# Patient Record
Sex: Female | Born: 1975 | Race: Black or African American | Hispanic: No | Marital: Married | Smoking: Never smoker
Health system: Southern US, Community
[De-identification: ages and names within clinical notes are randomized; demographics above are authoritative.]

## PROBLEM LIST (undated history)

## (undated) DIAGNOSIS — N926 Irregular menstruation, unspecified: Secondary | ICD-10-CM

## (undated) DIAGNOSIS — E282 Polycystic ovarian syndrome: Secondary | ICD-10-CM

## (undated) DIAGNOSIS — M2550 Pain in unspecified joint: Secondary | ICD-10-CM

## (undated) DIAGNOSIS — J309 Allergic rhinitis, unspecified: Secondary | ICD-10-CM

## (undated) DIAGNOSIS — N809 Endometriosis, unspecified: Secondary | ICD-10-CM

## (undated) DIAGNOSIS — R5383 Other fatigue: Secondary | ICD-10-CM

## (undated) DIAGNOSIS — B379 Candidiasis, unspecified: Secondary | ICD-10-CM

## (undated) DIAGNOSIS — F988 Other specified behavioral and emotional disorders with onset usually occurring in childhood and adolescence: Secondary | ICD-10-CM

## (undated) DIAGNOSIS — E739 Lactose intolerance, unspecified: Secondary | ICD-10-CM

## (undated) DIAGNOSIS — J45909 Unspecified asthma, uncomplicated: Secondary | ICD-10-CM

## (undated) DIAGNOSIS — K5900 Constipation, unspecified: Secondary | ICD-10-CM

## (undated) DIAGNOSIS — S83419A Sprain of medial collateral ligament of unspecified knee, initial encounter: Secondary | ICD-10-CM

## (undated) DIAGNOSIS — N946 Dysmenorrhea, unspecified: Secondary | ICD-10-CM

## (undated) DIAGNOSIS — F329 Major depressive disorder, single episode, unspecified: Secondary | ICD-10-CM

## (undated) DIAGNOSIS — N920 Excessive and frequent menstruation with regular cycle: Secondary | ICD-10-CM

## (undated) DIAGNOSIS — N939 Abnormal uterine and vaginal bleeding, unspecified: Secondary | ICD-10-CM

## (undated) DIAGNOSIS — F32A Depression, unspecified: Secondary | ICD-10-CM

## (undated) DIAGNOSIS — Z91018 Allergy to other foods: Secondary | ICD-10-CM

## (undated) DIAGNOSIS — N979 Female infertility, unspecified: Secondary | ICD-10-CM

## (undated) DIAGNOSIS — F439 Reaction to severe stress, unspecified: Secondary | ICD-10-CM

## (undated) DIAGNOSIS — Z803 Family history of malignant neoplasm of breast: Secondary | ICD-10-CM

## (undated) DIAGNOSIS — E559 Vitamin D deficiency, unspecified: Secondary | ICD-10-CM

## (undated) DIAGNOSIS — T7840XA Allergy, unspecified, initial encounter: Secondary | ICD-10-CM

## (undated) HISTORY — DX: Allergy to other foods: Z91.018

## (undated) HISTORY — PX: OTHER SURGICAL HISTORY: SHX169

## (undated) HISTORY — DX: Reaction to severe stress, unspecified: F43.9

## (undated) HISTORY — DX: Dysmenorrhea, unspecified: N94.6

## (undated) HISTORY — DX: Candidiasis, unspecified: B37.9

## (undated) HISTORY — DX: Lactose intolerance, unspecified: E73.9

## (undated) HISTORY — DX: Major depressive disorder, single episode, unspecified: F32.9

## (undated) HISTORY — DX: Allergic rhinitis, unspecified: J30.9

## (undated) HISTORY — DX: Depression, unspecified: F32.A

## (undated) HISTORY — PX: DILATION AND CURETTAGE OF UTERUS: SHX78

## (undated) HISTORY — DX: Vitamin D deficiency, unspecified: E55.9

## (undated) HISTORY — DX: Constipation, unspecified: K59.00

## (undated) HISTORY — DX: Family history of malignant neoplasm of breast: Z80.3

## (undated) HISTORY — DX: Female infertility, unspecified: N97.9

## (undated) HISTORY — DX: Abnormal uterine and vaginal bleeding, unspecified: N93.9

## (undated) HISTORY — DX: Irregular menstruation, unspecified: N92.6

## (undated) HISTORY — DX: Allergy, unspecified, initial encounter: T78.40XA

## (undated) HISTORY — DX: Other specified behavioral and emotional disorders with onset usually occurring in childhood and adolescence: F98.8

## (undated) HISTORY — DX: Pain in unspecified joint: M25.50

## (undated) HISTORY — DX: Other fatigue: R53.83

## (undated) HISTORY — DX: Excessive and frequent menstruation with regular cycle: N92.0

## (undated) HISTORY — DX: Unspecified asthma, uncomplicated: J45.909

## (undated) HISTORY — DX: Sprain of medial collateral ligament of unspecified knee, initial encounter: S83.419A

---

## 1998-10-17 ENCOUNTER — Emergency Department (HOSPITAL_COMMUNITY): Admission: EM | Admit: 1998-10-17 | Discharge: 1998-10-17 | Payer: Self-pay | Admitting: Emergency Medicine

## 1999-02-27 ENCOUNTER — Other Ambulatory Visit: Admission: RE | Admit: 1999-02-27 | Discharge: 1999-02-27 | Payer: Self-pay | Admitting: *Deleted

## 1999-04-27 ENCOUNTER — Encounter: Payer: Self-pay | Admitting: Emergency Medicine

## 1999-04-27 ENCOUNTER — Emergency Department (HOSPITAL_COMMUNITY): Admission: EM | Admit: 1999-04-27 | Discharge: 1999-04-27 | Payer: Self-pay | Admitting: Emergency Medicine

## 1999-06-15 ENCOUNTER — Ambulatory Visit (HOSPITAL_COMMUNITY): Admission: RE | Admit: 1999-06-15 | Discharge: 1999-06-15 | Payer: Self-pay | Admitting: *Deleted

## 1999-06-15 ENCOUNTER — Encounter: Payer: Self-pay | Admitting: *Deleted

## 1999-07-22 ENCOUNTER — Encounter: Admission: RE | Admit: 1999-07-22 | Discharge: 1999-08-04 | Payer: Self-pay | Admitting: *Deleted

## 1999-12-10 ENCOUNTER — Other Ambulatory Visit: Admission: RE | Admit: 1999-12-10 | Discharge: 1999-12-10 | Payer: Self-pay | Admitting: *Deleted

## 1999-12-18 ENCOUNTER — Other Ambulatory Visit: Admission: RE | Admit: 1999-12-18 | Discharge: 1999-12-18 | Payer: Self-pay | Admitting: *Deleted

## 2000-04-26 ENCOUNTER — Ambulatory Visit (HOSPITAL_COMMUNITY): Admission: RE | Admit: 2000-04-26 | Discharge: 2000-04-26 | Payer: Self-pay | Admitting: Emergency Medicine

## 2000-06-10 ENCOUNTER — Encounter: Admission: RE | Admit: 2000-06-10 | Discharge: 2000-06-10 | Payer: Self-pay | Admitting: Emergency Medicine

## 2000-06-17 ENCOUNTER — Encounter: Admission: RE | Admit: 2000-06-17 | Discharge: 2000-06-17 | Payer: Self-pay | Admitting: Emergency Medicine

## 2000-06-17 ENCOUNTER — Encounter: Payer: Self-pay | Admitting: Emergency Medicine

## 2000-12-15 ENCOUNTER — Other Ambulatory Visit: Admission: RE | Admit: 2000-12-15 | Discharge: 2000-12-15 | Payer: Self-pay | Admitting: *Deleted

## 2002-01-04 HISTORY — PX: REDUCTION MAMMAPLASTY: SUR839

## 2002-01-04 HISTORY — PX: BREAST SURGERY: SHX581

## 2002-10-01 ENCOUNTER — Ambulatory Visit (HOSPITAL_BASED_OUTPATIENT_CLINIC_OR_DEPARTMENT_OTHER): Admission: RE | Admit: 2002-10-01 | Discharge: 2002-10-01 | Payer: Self-pay | Admitting: Specialist

## 2002-10-01 ENCOUNTER — Encounter (INDEPENDENT_AMBULATORY_CARE_PROVIDER_SITE_OTHER): Payer: Self-pay | Admitting: *Deleted

## 2002-10-01 ENCOUNTER — Ambulatory Visit (HOSPITAL_COMMUNITY): Admission: RE | Admit: 2002-10-01 | Discharge: 2002-10-01 | Payer: Self-pay | Admitting: Specialist

## 2005-04-04 ENCOUNTER — Emergency Department: Payer: Self-pay | Admitting: Emergency Medicine

## 2008-01-05 DIAGNOSIS — E282 Polycystic ovarian syndrome: Secondary | ICD-10-CM

## 2008-01-05 HISTORY — PX: MASS EXCISION: SHX2000

## 2008-01-05 HISTORY — DX: Polycystic ovarian syndrome: E28.2

## 2009-06-19 ENCOUNTER — Ambulatory Visit (HOSPITAL_COMMUNITY): Admission: RE | Admit: 2009-06-19 | Discharge: 2009-06-19 | Payer: Self-pay | Admitting: Obstetrics and Gynecology

## 2010-02-13 ENCOUNTER — Emergency Department: Payer: Self-pay | Admitting: Unknown Physician Specialty

## 2010-03-22 LAB — CBC
HCT: 40.2 % (ref 36.0–46.0)
Hemoglobin: 13.3 g/dL (ref 12.0–15.0)
MCHC: 33 g/dL (ref 30.0–36.0)
MCV: 90.7 fL (ref 78.0–100.0)
Platelets: 223 10*3/uL (ref 150–400)
RBC: 4.43 MIL/uL (ref 3.87–5.11)
RDW: 14.2 % (ref 11.5–15.5)
WBC: 4.9 10*3/uL (ref 4.0–10.5)

## 2010-03-22 LAB — HCG, SERUM, QUALITATIVE: Preg, Serum: NEGATIVE

## 2010-05-22 NOTE — Op Note (Signed)
NAME:  Michele Hansen, Michele Hansen                        ACCOUNT NO.:  [id number]   MEDICAL RECORD NO.:  [id number]                   PATIENT TYPE:  AMB   LOCATION:  DSC                                  FACILITY:  [facility]   PHYSICIAN:  Earvin Hansen L. Shon Hough, M.D.           DATE OF BIRTH:  October 18, 1975   DATE OF PROCEDURE:  10/01/2002  DATE OF DISCHARGE:                                 OPERATIVE REPORT   INDICATIONS FOR PROCEDURE:  This 35 year old has severe, severe macromastia,  with back and shoulder pain, secondary to large pendulous breasts, with a  history of increased pitting of both shoulder areas, as well as inter-  trigonous changes underneath the breast areas.  She also has a history of  pain and discomfort in her back and neck.  She wears a special bra, to no  avail.  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  She has tried over-the-counter  medications, including Tylenol, Advil, Motrin, fungal creams, other creams  to improve her symptoms, with no avail.  The patient is now being prepared  for bilateral breast reductions using the inferior pedicle technique, a  reduction of the accessory breast tissue in the right and left breast areas,  and chest areas, axillary, and latissimus dorsi regions.   ANESTHESIA:  General.   SURGEON:  Gerald L. Shon Hough, M.D.   ASSISTANT:  Alethia Berthold, C.F.A., P.A.-C.   DESCRIPTION OF PROCEDURE:  The patient was set up and drawn for the inferior  pedicle reduction  mammoplasty, re-marking the nipple-areolar complexes from  over 40 cm to 22.  She then underwent general anesthesia and was intubated  orally.  A prep was done to the chest and breast areas in a routine fashion  using Betadine soap and solution, and walled off with sterile towels and  drapes, so as to make a sterile field.  Xylocaine 0.25% with epinephrine was  injected into each breast area, 1:400,000 concentration, a total of 200 mL  per side for vasoconstriction.  The wounds were scored with a  #15 blade and  then the skin over the inferior pedicle was degrees-epithelialized with a  #20 blade.  The medial and lateral fatty dermal pedicles were incised down  to underlying fascia.  Out laterally more tissue was removed, as well as in  the axillary and latissimus dorsi areas, with liposuction assistance a  removal of large volumes of accessory breast tissue.  The new key hole area  was debulked as well, and then the inferior pedicle was trimmed  appropriately.  After proper hemostasis, the flaps were transposed and  stayed with #3-0 Prolene.  A subcutaneous closure was done with #3-0  Monocryl x2 layers, and one subcuticular stitch of #3-0 and #5-0 Monocryl  throughout the inverted T.  The wounds were drained with a #10 Blake drain  fully-fluted, which was placed in the depths of the wound and brought out  through the lateral-most  portion of the incision and secured with #3-0  Prolene.  The wounds were cleansed.  Steri-Strips and soft dressings applied  to all the areas.  The same procedure was carried out on both sides, removing over 1300 g on  the left side and over 1200 g on the right.  She was taken to the recovery  room with excellent blood supply to the nipple-areolar complexes.                                                Yaakov Guthrie. Shon Hough, M.D.    Cathie Hoops  D:  10/01/2002  T:  10/01/2002  Job:  [id number]

## 2011-01-20 ENCOUNTER — Emergency Department: Payer: Self-pay | Admitting: Unknown Physician Specialty

## 2011-01-20 DIAGNOSIS — N946 Dysmenorrhea, unspecified: Secondary | ICD-10-CM | POA: Insufficient documentation

## 2011-01-20 LAB — URINALYSIS, COMPLETE
Bacteria: NONE SEEN
Bilirubin,UR: NEGATIVE
Ketone: NEGATIVE
Nitrite: NEGATIVE
Protein: NEGATIVE
RBC,UR: 304 /HPF (ref 0–5)
Specific Gravity: 1.011 (ref 1.003–1.030)
Squamous Epithelial: <1
WBC UR: 4 /HPF (ref 0–5)

## 2011-01-20 LAB — COMPREHENSIVE METABOLIC PANEL
Alkaline Phosphatase: 55 U/L (ref 50–136)
Anion Gap: 8 (ref 7–16)
BUN: 10 mg/dL (ref 7–18)
Bilirubin,Total: 0.4 mg/dL (ref 0.2–1.0)
Calcium, Total: 8.9 mg/dL (ref 8.5–10.1)
Co2: 23 mmol/L (ref 21–32)
EGFR (Non-African Amer.): >60
Osmolality: 276 (ref 275–301)
Potassium: 4.1 mmol/L (ref 3.5–5.1)
Sodium: 139 mmol/L (ref 136–145)
Total Protein: 7.7 g/dL (ref 6.4–8.2)

## 2011-01-20 LAB — CBC WITH DIFFERENTIAL/PLATELET
Basophil #: 0 10*3/uL (ref 0.0–0.1)
Basophil %: 0.2 %
Eosinophil #: 0.1 10*3/uL (ref 0.0–0.7)
HCT: 36.1 % (ref 35.0–47.0)
Lymphocyte #: 1.7 10*3/uL (ref 1.0–3.6)
MCHC: 33.3 g/dL (ref 32.0–36.0)
MCV: 90 fL (ref 80–100)
Monocyte %: 5.9 %
Neutrophil %: 64.5 %
Platelet: 208 10*3/uL (ref 150–440)
RBC: 4.03 10*6/uL (ref 3.80–5.20)
RDW: 13.5 % (ref 11.5–14.5)
WBC: 6 10*3/uL (ref 3.6–11.0)

## 2012-01-05 DIAGNOSIS — N809 Endometriosis, unspecified: Secondary | ICD-10-CM

## 2012-01-05 HISTORY — DX: Endometriosis, unspecified: N80.9

## 2012-06-02 ENCOUNTER — Ambulatory Visit: Payer: Self-pay | Admitting: General Practice

## 2012-06-04 DIAGNOSIS — E282 Polycystic ovarian syndrome: Secondary | ICD-10-CM | POA: Insufficient documentation

## 2012-06-04 DIAGNOSIS — E669 Obesity, unspecified: Secondary | ICD-10-CM | POA: Insufficient documentation

## 2013-01-04 HISTORY — PX: BREAST CYST EXCISION: SHX579

## 2013-01-04 HISTORY — PX: CYSTECTOMY: SUR359

## 2013-01-24 ENCOUNTER — Ambulatory Visit: Payer: Self-pay | Admitting: Obstetrics and Gynecology

## 2013-01-31 ENCOUNTER — Ambulatory Visit: Payer: Self-pay | Admitting: Obstetrics and Gynecology

## 2013-01-31 LAB — HM MAMMOGRAPHY

## 2014-01-16 LAB — HM PAP SMEAR: HM Pap smear: NEGATIVE

## 2014-05-09 ENCOUNTER — Encounter
Admission: RE | Admit: 2014-05-09 | Discharge: 2014-05-09 | Disposition: A | Discharge status: Home / Self Care | Payer: PRIVATE HEALTH INSURANCE | Source: Ambulatory Visit | Attending: Obstetrics and Gynecology | Admitting: Obstetrics and Gynecology

## 2014-05-09 DIAGNOSIS — Z833 Family history of diabetes mellitus: Secondary | ICD-10-CM | POA: Diagnosis not present

## 2014-05-09 DIAGNOSIS — Z91048 Other nonmedicinal substance allergy status: Secondary | ICD-10-CM | POA: Diagnosis not present

## 2014-05-09 DIAGNOSIS — Z800 Family history of malignant neoplasm of digestive organs: Secondary | ICD-10-CM | POA: Diagnosis not present

## 2014-05-09 DIAGNOSIS — Z803 Family history of malignant neoplasm of breast: Secondary | ICD-10-CM | POA: Diagnosis not present

## 2014-05-09 DIAGNOSIS — Z79899 Other long term (current) drug therapy: Secondary | ICD-10-CM | POA: Diagnosis not present

## 2014-05-09 DIAGNOSIS — R5383 Other fatigue: Secondary | ICD-10-CM | POA: Diagnosis not present

## 2014-05-09 DIAGNOSIS — N939 Abnormal uterine and vaginal bleeding, unspecified: Secondary | ICD-10-CM | POA: Diagnosis present

## 2014-05-09 DIAGNOSIS — Z91040 Latex allergy status: Secondary | ICD-10-CM | POA: Diagnosis not present

## 2014-05-09 DIAGNOSIS — N921 Excessive and frequent menstruation with irregular cycle: Secondary | ICD-10-CM | POA: Diagnosis not present

## 2014-05-09 DIAGNOSIS — N970 Female infertility associated with anovulation: Secondary | ICD-10-CM | POA: Diagnosis not present

## 2014-05-09 DIAGNOSIS — J309 Allergic rhinitis, unspecified: Secondary | ICD-10-CM | POA: Diagnosis not present

## 2014-05-09 HISTORY — DX: Endometriosis, unspecified: N80.9

## 2014-05-09 HISTORY — DX: Polycystic ovarian syndrome: E28.2

## 2014-05-09 LAB — CBC
HCT: 37.6 % (ref 35.0–47.0)
Hemoglobin: 12 g/dL (ref 12.0–16.0)
MCH: 27.1 pg (ref 26.0–34.0)
MCHC: 31.8 g/dL — AB (ref 32.0–36.0)
MCV: 85.2 fL (ref 80.0–100.0)
Platelets: 223 10*3/uL (ref 150–440)
RBC: 4.42 MIL/uL (ref 3.80–5.20)
RDW: 15.4 % — ABNORMAL HIGH (ref 11.5–14.5)
WBC: 5 10*3/uL (ref 3.6–11.0)

## 2014-05-09 NOTE — Patient Instructions (Signed)
  Your procedure is scheduled on: May 13, 2014 Report to Same Day Surgery. To find out your arrival time please call [phone] between 1PM - 3PM on May 10, 2014.  Remember: Instructions that are not followed completely may result in serious medical risk, up to and including death, or upon the discretion of your surgeon and anesthesiologist your surgery may need to be rescheduled.    __x__ 1. Do not eat food or drink liquids after midnight. No gum chewing or hard candies.     __x__ 2. No Alcohol for 24 hours before or after surgery.   ____ 3. Bring all medications with you on the day of surgery if instructed.    ____ 4. Notify your doctor if there is any change in your medical condition     (cold, fever, infections).     Do not wear jewelry, make-up, hairpins, clips or nail polish.  Do not wear lotions, powders, or perfumes. You may wear deodorant.  Do not shave 48 hours prior to surgery. Men may shave face and neck.  Do not bring valuables to the hospital.    [facility] belongings or valuables.               Contacts, dentures or bridgework may not be worn into surgery.  Leave your suitcase in the car. After surgery it may be brought to your room.  For patients admitted to the hospital, discharge time is determined by your treatment team.   Patients discharged the day of surgery will not be allowed to drive home.   Please read over the following fact sheets that you were given:      ____ Take these medicines the morning of surgery with A SIP OF WATER: NONE     ____ Fleet Enema (as directed)   ____ Use CHG Soap as directed  ____ Use inhalers on the day of surgery  __x__ Stop metformin 2 days prior to surgery    ____ Take 1/2 of usual insulin dose the night before surgery and none on the morning of surgery.   ____ Stop Coumadin/Plavix/aspirin on NA  ____ Stop Anti-inflammatories on today.   ____ Stop supplements until after surgery.     ____ Bring C-Pap to the hospital.

## 2014-05-10 LAB — HIV ANTIBODY (ROUTINE TESTING W REFLEX): HIV SCREEN 4TH GENERATION: NONREACTIVE

## 2014-05-13 ENCOUNTER — Encounter: Payer: Self-pay | Admitting: Anesthesiology

## 2014-05-13 ENCOUNTER — Encounter
Admission: RE | Disposition: A | Discharge status: Home / Self Care | Payer: Self-pay | Source: Ambulatory Visit | Attending: Obstetrics and Gynecology

## 2014-05-13 ENCOUNTER — Ambulatory Visit
Admission: RE | Admit: 2014-05-13 | Discharge: 2014-05-13 | Disposition: A | Discharge status: Home / Self Care | Payer: PRIVATE HEALTH INSURANCE | Source: Ambulatory Visit | Attending: Obstetrics and Gynecology | Admitting: Obstetrics and Gynecology

## 2014-05-13 ENCOUNTER — Ambulatory Visit: Payer: PRIVATE HEALTH INSURANCE | Admitting: Anesthesiology

## 2014-05-13 DIAGNOSIS — N921 Excessive and frequent menstruation with irregular cycle: Secondary | ICD-10-CM | POA: Insufficient documentation

## 2014-05-13 DIAGNOSIS — Z91040 Latex allergy status: Secondary | ICD-10-CM | POA: Insufficient documentation

## 2014-05-13 DIAGNOSIS — J309 Allergic rhinitis, unspecified: Secondary | ICD-10-CM | POA: Insufficient documentation

## 2014-05-13 DIAGNOSIS — N970 Female infertility associated with anovulation: Secondary | ICD-10-CM | POA: Insufficient documentation

## 2014-05-13 DIAGNOSIS — Z833 Family history of diabetes mellitus: Secondary | ICD-10-CM | POA: Insufficient documentation

## 2014-05-13 DIAGNOSIS — Z79899 Other long term (current) drug therapy: Secondary | ICD-10-CM | POA: Insufficient documentation

## 2014-05-13 DIAGNOSIS — Z800 Family history of malignant neoplasm of digestive organs: Secondary | ICD-10-CM | POA: Insufficient documentation

## 2014-05-13 DIAGNOSIS — Z91048 Other nonmedicinal substance allergy status: Secondary | ICD-10-CM | POA: Insufficient documentation

## 2014-05-13 DIAGNOSIS — Z803 Family history of malignant neoplasm of breast: Secondary | ICD-10-CM | POA: Insufficient documentation

## 2014-05-13 DIAGNOSIS — R5383 Other fatigue: Secondary | ICD-10-CM | POA: Insufficient documentation

## 2014-05-13 HISTORY — PX: HYSTEROSCOPY W/D&C: SHX1775

## 2014-05-13 LAB — POCT PREGNANCY, URINE: Preg Test, Ur: NEGATIVE

## 2014-05-13 SURGERY — DILATATION AND CURETTAGE /HYSTEROSCOPY
Anesthesia: General

## 2014-05-13 MED ORDER — LIDOCAINE HCL (CARDIAC) 20 MG/ML IV SOLN
INTRAVENOUS | Status: DC | PRN
  Administered 2014-05-13: 80 mg via INTRAVENOUS

## 2014-05-13 MED ORDER — OXYCODONE-ACETAMINOPHEN 5-325 MG PO TABS: 1.0000 | ORAL_TABLET | ORAL | Status: DC | PRN

## 2014-05-13 MED ORDER — ONDANSETRON HCL 4 MG/2ML IJ SOLN
INTRAMUSCULAR | Status: DC | PRN
  Administered 2014-05-13: 4 mg via INTRAVENOUS

## 2014-05-13 MED ORDER — FENTANYL CITRATE (PF) 100 MCG/2ML IJ SOLN
INTRAMUSCULAR | Status: DC
Start: 2014-05-13 — End: 2014-05-13
  Filled 2014-05-13: qty 2

## 2014-05-13 MED ORDER — FAMOTIDINE 20 MG PO TABS
ORAL_TABLET | ORAL | Status: AC
  Filled 2014-05-13: qty 1

## 2014-05-13 MED ORDER — PROPOFOL 10 MG/ML IV BOLUS
INTRAVENOUS | Status: DC | PRN
  Administered 2014-05-13: 250 mg via INTRAVENOUS
  Administered 2014-05-13: 100 mg via INTRAVENOUS

## 2014-05-13 MED ORDER — DEXAMETHASONE SODIUM PHOSPHATE 4 MG/ML IJ SOLN
INTRAMUSCULAR | Status: DC | PRN
  Administered 2014-05-13: 10 mg via INTRAVENOUS

## 2014-05-13 MED ORDER — LACTATED RINGERS IV SOLN
INTRAVENOUS | Status: DC
  Administered 2014-05-13 (×3): via INTRAVENOUS

## 2014-05-13 MED ORDER — IBUPROFEN 800 MG PO TABS: 800.0000 mg | ORAL_TABLET | Freq: Three times a day (TID) | ORAL | Status: DC

## 2014-05-13 MED ORDER — FENTANYL CITRATE (PF) 100 MCG/2ML IJ SOLN
25.0000 ug | INTRAMUSCULAR | Status: DC | PRN
  Administered 2014-05-13 (×3): 25 ug via INTRAVENOUS

## 2014-05-13 MED ORDER — MIDAZOLAM HCL 2 MG/2ML IJ SOLN
INTRAMUSCULAR | Status: DC | PRN
  Administered 2014-05-13: 2 mg via INTRAVENOUS

## 2014-05-13 MED ORDER — FENTANYL CITRATE (PF) 100 MCG/2ML IJ SOLN
INTRAMUSCULAR | Status: DC | PRN
  Administered 2014-05-13 (×2): 50 ug via INTRAVENOUS

## 2014-05-13 MED ORDER — SILVER NITRATE-POT NITRATE 75-25 % EX MISC
CUTANEOUS | Status: AC
  Filled 2014-05-13: qty 2

## 2014-05-13 MED ORDER — FAMOTIDINE 20 MG PO TABS
20.0000 mg | ORAL_TABLET | Freq: Once | ORAL | Status: AC
  Administered 2014-05-13: 20 mg via ORAL

## 2014-05-13 SURGICAL SUPPLY — 14 items
CATH ROBINSON RED A/P 16FR (CATHETERS) ×2 IMPLANT
GLOVE BIO SURGEON STRL SZ8 (GLOVE) ×4 IMPLANT
GOWN STRL REUS W/ TWL LRG LVL3 (GOWN DISPOSABLE) ×1 IMPLANT
GOWN STRL REUS W/ TWL XL LVL3 (GOWN DISPOSABLE) ×1 IMPLANT
GOWN STRL REUS W/TWL LRG LVL3 (GOWN DISPOSABLE) ×1
GOWN STRL REUS W/TWL XL LVL3 (GOWN DISPOSABLE) ×1
IV LACTATED RINGERS 1000ML (IV SOLUTION) ×2 IMPLANT
JELLY LUB 2OZ STRL (MISCELLANEOUS) ×1
JELLY LUBE 2OZ STRL (MISCELLANEOUS) ×1 IMPLANT
KIT RM TURNOVER CYSTO AR (KITS) ×2 IMPLANT
NS IRRIG 500ML POUR BTL (IV SOLUTION) ×2 IMPLANT
PACK DNC HYST (MISCELLANEOUS) ×2 IMPLANT
PAD OB MATERNITY 4.3X12.25 (PERSONAL CARE ITEMS) ×2 IMPLANT
PAD PREP 24X41 OB/GYN DISP (PERSONAL CARE ITEMS) ×2 IMPLANT

## 2014-05-13 NOTE — Transfer of Care (Signed)
Immediate Anesthesia Transfer of Care Note  Patient: Michele NorrisMeteea J Ivey  Procedure(s) Performed: Procedure(s): DILATATION AND CURETTAGE /HYSTEROSCOPY (N/A)  Patient Location: PACU  Anesthesia Type:General  Level of Consciousness: awake, alert  and oriented  Airway & Oxygen Therapy: Patient Spontanous Breathing and Patient connected to face mask oxygen  Post-op Assessment: Report given to RN and Post -op Vital signs reviewed and stable  Post vital signs: Reviewed and stable  Last Vitals:  Filed Vitals:   05/13/14 0800  BP: 118/83  Pulse: 86  Temp: 36.6 C  Resp: 16    Complications: No apparent anesthesia complications

## 2014-05-13 NOTE — Op Note (Signed)
OPERATIVE NOTE  Date of surgery: 05/13/2014  Preoperative diagnosis: Menometrorrhagia Postoperative diagnosis: Menometrorrhagia Operative procedure: 1. Hysteroscopy 2. Dilation and curettage of endometrium  Surgeon: Dr. Beatris Sie Francesco Anesthesia: Gen LMA   Findings: Gynecoid pelvis. Uterus sounded to 9 cm. Normal appearing endometrial cavity without gross lesions.  Description of procedure Patient was brought to the operating room where she was placed in supine position. General anesthesia was induced without difficulty using the LMA technique. The patient was placed in dorsal lithotomy position using candy cane stirrups. A betadine perineal intravaginal prep and drape was performed in standard fashion. Weighted speculum was placed in the vagina. Single-tooth tenaculum was placed on the anterior cervix. Uterus was sounded to 9 cm. Hanks dilators were used up to a #20 [place] caliber to dilate the endocervical canal. The [facility] hysteroscope using lactated Ringer's as irrigant was used for the hysteroscopy. The above-noted findings were photo documented. The hysteroscope removed. The smooth and serrated curettes were then used to curettage the endometrial cavity with production of average tissue. Stone polyp forceps were used to grasp residual tissue left behind. Repeat hysteroscopy was performed and demonstrated excellent sampling. Procedure was then terminated with all instrumentation being removed from the vagina. The patient was then awakened mobilized and taken to the recovery room in satisfactory condition.  IV fluids: 700 mL. Urine output: 350 mL. EBL: Less than 20 mL.  Instruments, needles, and sponge counts were verified as correct.

## 2014-05-13 NOTE — Anesthesia Preprocedure Evaluation (Signed)
Anesthesia Evaluation  Patient identified by MRN, date of birth, ID band Patient awake    Reviewed: Allergy & Precautions, NPO status , Patient's Chart, lab work & pertinent test results  Airway Mallampati: II  TM Distance: >3 FB Neck ROM: Full    Dental  (+) Chipped   Pulmonary          Cardiovascular     Neuro/Psych    GI/Hepatic   Endo/Other    Renal/GU      Musculoskeletal   Abdominal   Peds  Hematology   Anesthesia Other Findings   Reproductive/Obstetrics                             Anesthesia Physical Anesthesia Plan  ASA: II  Anesthesia Plan: General   Post-op Pain Management:    Induction: Intravenous  Airway Management Planned: LMA  Additional Equipment:   Intra-op Plan:   Post-operative Plan:   Informed Consent: I have reviewed the patients History and Physical, chart, labs and discussed the procedure including the risks, benefits and alternatives for the proposed anesthesia with the patient or authorized representative who has indicated his/her understanding and acceptance.     Plan Discussed with: CRNA  Anesthesia Plan Comments:         Anesthesia Quick Evaluation

## 2014-05-13 NOTE — Anesthesia Postprocedure Evaluation (Signed)
  Anesthesia Post-op Note  Patient: Michele NorrisMeteea J Hansen  Procedure(s) Performed: Procedure(s): DILATATION AND CURETTAGE /HYSTEROSCOPY (N/A)  Anesthesia type:General  Patient location: PACU  Post pain: Pain level controlled  Post assessment: Post-op Vital signs reviewed, Patient's Cardiovascular Status Stable, Respiratory Function Stable, Patent Airway and No signs of Nausea or vomiting  Post vital signs: Reviewed and stable  Last Vitals:  Filed Vitals:   05/13/14 1245  BP: 136/88  Pulse: 61  Temp:   Resp: 15    Level of consciousness: awake, alert  and patient cooperative  Complications: No apparent anesthesia complications

## 2014-05-13 NOTE — H&P (Signed)
Date of Initial H&P: 05/09/14  History reviewed, patient examined, no change in status, stable for surgery. 

## 2014-05-14 LAB — SURGICAL PATHOLOGY

## 2014-05-15 ENCOUNTER — Encounter: Payer: Self-pay | Admitting: Obstetrics and Gynecology

## 2014-07-17 ENCOUNTER — Telehealth: Payer: Self-pay | Admitting: Obstetrics and Gynecology

## 2014-07-17 NOTE — Telephone Encounter (Signed)
Pt called and her and Dr Tommi Rumpse discussed her going back on birthcontral, and she is wanting to because is tired of the bleeding, she wanted to know if he could prescribe this for her without her coming in for OV or not since they had talked about it. She wants a BC still with the hormone in it. [facility] uses the [facility] [place]. Pt would like a call back if she can get the RX. Pt stated you can leave message if she does not answer since she is in clinic today.

## 2014-07-19 ENCOUNTER — Telehealth: Payer: Self-pay | Admitting: Obstetrics and Gynecology

## 2014-07-19 MED ORDER — NORETHIN ACE-ETH ESTRAD-FE 1-20 MG-MCG PO TABS: 1.0000 | ORAL_TABLET | Freq: Every day | ORAL | Status: DC

## 2014-07-19 NOTE — Telephone Encounter (Signed)
PT CALLED EARLIER THIS WEEK AND HAS NOT HERD BACK AND WAS WANTING A CALL BACK TODAY.

## 2014-07-19 NOTE — Telephone Encounter (Signed)
Pt aware per mad to take continuously 84/7 regimen. Rx erx.

## 2014-07-19 NOTE — Telephone Encounter (Signed)
See previous message

## 2014-07-23 ENCOUNTER — Telehealth: Payer: Self-pay | Admitting: Obstetrics and Gynecology

## 2014-07-23 NOTE — Telephone Encounter (Signed)
Pt aware mad did rx generic. For now pt is ok with junel 1/20. Advised to see if [facility] was any cheaper.

## 2014-07-23 NOTE — Telephone Encounter (Signed)
PT CALLED BACK AND WANTED ME TO LET YOU KNOW TO DISREGARD THE MESSAGE THAT I SENT YOU PREVIOUSLY. SHE FOUND OUT THE INFORMATION TO HER QUESTION

## 2014-07-23 NOTE — Telephone Encounter (Signed)
PT CALLED AND WANTED TO KNOW IF THERE WAS A GENERIC FOR THE BC THAT DR DE PERSCRIBED HER, SHE STATED IF NOT THEN THAT IS OK BUT SHE WANTED TO SEE SINCE ITS 22.00 A MONTH, SHE STATED YOU CAN CALL HER BACK AND LEAVE A MESSAGE. SHE DID GET THE FIRST MONTH AND STARTED THAT PACK BUT GOING FORWARD SINCE SHE TAKES THEN CONTINUOSLY IF THERE WAS A GENERIC.

## 2014-08-06 ENCOUNTER — Telehealth: Payer: Self-pay | Admitting: Obstetrics and Gynecology

## 2014-08-14 ENCOUNTER — Telehealth: Payer: Self-pay | Admitting: Obstetrics and Gynecology

## 2014-08-14 NOTE — Telephone Encounter (Signed)
LM on cell phone to contact office. N/a on work phone.

## 2014-08-14 NOTE — Telephone Encounter (Signed)
PT CALLED AND SHE IS IN PAIN, FEEL WEAK AND NAUSEOUS, AND SHE IS BLEEDING, SHE IS TALKING THE IBPROFEN EVERY 8 HOURS AND IS ONLY TAKING 1 PERCOCET DUE TO HER HAVING TO WORK, SHE IS SOKING A PAD EVERY 2-3 HOURS. SHE STATED THAT THIS PAST WEEKEND SHE MESSED UP CLOSE BECAUSE OF THE BLEEDING. SHE DOESNT KNOW WHAT HSE NEEDS TO DO, PT WOULD LIKE A CALL BACK.

## 2014-08-14 NOTE — Telephone Encounter (Signed)
Pt advised that anytime she starts a new ocp(7/20) she can have irregular bleeding. Advised to take pill same time qd. May want to take iron supplements qd. Keep f/u appt on 8/17.

## 2014-08-20 ENCOUNTER — Emergency Department: Payer: No Typology Code available for payment source

## 2014-08-20 ENCOUNTER — Ambulatory Visit (INDEPENDENT_AMBULATORY_CARE_PROVIDER_SITE_OTHER): Payer: Self-pay | Admitting: Obstetrics and Gynecology

## 2014-08-20 ENCOUNTER — Encounter: Payer: Self-pay | Admitting: Emergency Medicine

## 2014-08-20 ENCOUNTER — Encounter: Payer: Self-pay | Admitting: Obstetrics and Gynecology

## 2014-08-20 ENCOUNTER — Emergency Department
Admission: EM | Admit: 2014-08-20 | Discharge: 2014-08-20 | Disposition: A | Discharge status: Home / Self Care | Payer: No Typology Code available for payment source | Attending: Emergency Medicine | Admitting: Emergency Medicine

## 2014-08-20 VITALS — BP 122/85 | HR 76 | Temp 98.4°F | Ht 68.0 in | Wt 295.3 lb

## 2014-08-20 DIAGNOSIS — N809 Endometriosis, unspecified: Secondary | ICD-10-CM

## 2014-08-20 DIAGNOSIS — N8003 Adenomyosis of the uterus: Secondary | ICD-10-CM

## 2014-08-20 DIAGNOSIS — Z3202 Encounter for pregnancy test, result negative: Secondary | ICD-10-CM | POA: Diagnosis not present

## 2014-08-20 DIAGNOSIS — Z791 Long term (current) use of non-steroidal anti-inflammatories (NSAID): Secondary | ICD-10-CM | POA: Insufficient documentation

## 2014-08-20 DIAGNOSIS — N921 Excessive and frequent menstruation with irregular cycle: Secondary | ICD-10-CM | POA: Insufficient documentation

## 2014-08-20 DIAGNOSIS — R109 Unspecified abdominal pain: Secondary | ICD-10-CM

## 2014-08-20 DIAGNOSIS — Z79899 Other long term (current) drug therapy: Secondary | ICD-10-CM | POA: Diagnosis not present

## 2014-08-20 DIAGNOSIS — R102 Pelvic and perineal pain: Secondary | ICD-10-CM | POA: Diagnosis present

## 2014-08-20 DIAGNOSIS — N800 Endometriosis of uterus: Secondary | ICD-10-CM

## 2014-08-20 DIAGNOSIS — R52 Pain, unspecified: Secondary | ICD-10-CM

## 2014-08-20 DIAGNOSIS — Z91040 Latex allergy status: Secondary | ICD-10-CM | POA: Diagnosis not present

## 2014-08-20 DIAGNOSIS — T7840XA Allergy, unspecified, initial encounter: Secondary | ICD-10-CM | POA: Insufficient documentation

## 2014-08-20 DIAGNOSIS — J9801 Acute bronchospasm: Secondary | ICD-10-CM | POA: Insufficient documentation

## 2014-08-20 LAB — CHLAMYDIA/NGC RT PCR (ARMC ONLY)
CHLAMYDIA TR: NOT DETECTED
N gonorrhoeae: NOT DETECTED

## 2014-08-20 LAB — COMPREHENSIVE METABOLIC PANEL
ALBUMIN: 3.8 g/dL (ref 3.5–5.0)
ALT: 16 U/L (ref 14–54)
ANION GAP: 7 (ref 5–15)
AST: 19 U/L (ref 15–41)
Alkaline Phosphatase: 53 U/L (ref 38–126)
BILIRUBIN TOTAL: 0.3 mg/dL (ref 0.3–1.2)
BUN: 12 mg/dL (ref 6–20)
CHLORIDE: 109 mmol/L (ref 101–111)
CO2: 21 mmol/L — ABNORMAL LOW (ref 22–32)
Calcium: 8.9 mg/dL (ref 8.9–10.3)
Creatinine, Ser: 0.81 mg/dL (ref 0.44–1.00)
GFR calc Af Amer: >60 mL/min (ref >60)
GFR calc non Af Amer: >60 mL/min (ref >60)
GLUCOSE: 73 mg/dL (ref 65–99)
POTASSIUM: 4.1 mmol/L (ref 3.5–5.1)
Sodium: 137 mmol/L (ref 135–145)
TOTAL PROTEIN: 7.5 g/dL (ref 6.5–8.1)

## 2014-08-20 LAB — URINALYSIS COMPLETE WITH MICROSCOPIC (ARMC ONLY)
BACTERIA UA: NONE SEEN
Bilirubin Urine: NEGATIVE
GLUCOSE, UA: NEGATIVE mg/dL
Ketones, ur: NEGATIVE mg/dL
Nitrite: NEGATIVE
Protein, ur: 30 mg/dL — AB
Specific Gravity, Urine: 1.024 (ref 1.005–1.030)
pH: 6 (ref 5.0–8.0)

## 2014-08-20 LAB — CBC WITH DIFFERENTIAL/PLATELET
BASOS ABS: 0 10*3/uL (ref 0–0.1)
BASOS PCT: 1 %
EOS ABS: 0.3 10*3/uL (ref 0–0.7)
EOS PCT: 4 %
HCT: 34.5 % — ABNORMAL LOW (ref 35.0–47.0)
Hemoglobin: 11 g/dL — ABNORMAL LOW (ref 12.0–16.0)
Lymphocytes Relative: 27 %
Lymphs Abs: 2.3 10*3/uL (ref 1.0–3.6)
MCH: 27.5 pg (ref 26.0–34.0)
MCHC: 31.8 g/dL — AB (ref 32.0–36.0)
MCV: 86.4 fL (ref 80.0–100.0)
MONO ABS: 0.6 10*3/uL (ref 0.2–0.9)
MONOS PCT: 7 %
Neutro Abs: 5.2 10*3/uL (ref 1.4–6.5)
Neutrophils Relative %: 61 %
PLATELETS: 195 10*3/uL (ref 150–440)
RBC: 4 MIL/uL (ref 3.80–5.20)
RDW: 15.9 % — AB (ref 11.5–14.5)
WBC: 8.5 10*3/uL (ref 3.6–11.0)

## 2014-08-20 LAB — WET PREP, GENITAL
CLUE CELLS WET PREP: NONE SEEN
TRICH WET PREP: NONE SEEN
YEAST WET PREP: NONE SEEN

## 2014-08-20 LAB — PREGNANCY, URINE: PREG TEST UR: NEGATIVE

## 2014-08-20 MED ORDER — ONDANSETRON HCL 4 MG/2ML IJ SOLN
4.0000 mg | Freq: Once | INTRAMUSCULAR | Status: AC
  Administered 2014-08-20: 4 mg via INTRAVENOUS
  Filled 2014-08-20: qty 2

## 2014-08-20 MED ORDER — HYDROMORPHONE HCL 1 MG/ML IJ SOLN
1.0000 mg | Freq: Once | INTRAMUSCULAR | Status: AC
  Administered 2014-08-20: 1 mg via INTRAVENOUS
  Filled 2014-08-20: qty 1

## 2014-08-20 MED ORDER — IOHEXOL 240 MG/ML SOLN
25.0000 mL | Freq: Once | INTRAMUSCULAR | Status: AC | PRN
  Administered 2014-08-20: 25 mL via ORAL
  Filled 2014-08-20: qty 25

## 2014-08-20 MED ORDER — MORPHINE SULFATE (PF) 4 MG/ML IV SOLN
4.0000 mg | Freq: Once | INTRAVENOUS | Status: AC
  Administered 2014-08-20: 4 mg via INTRAVENOUS
  Filled 2014-08-20: qty 1

## 2014-08-20 MED ORDER — OXYCODONE-ACETAMINOPHEN 5-325 MG PO TABS
1.0000 | ORAL_TABLET | Freq: Once | ORAL | Status: AC
  Administered 2014-08-20: 1 via ORAL
  Filled 2014-08-20: qty 1

## 2014-08-20 MED ORDER — NORETHINDRONE ACET-ETHINYL EST 1.5-30 MG-MCG PO TABS: ORAL_TABLET | ORAL | Status: DC

## 2014-08-20 MED ORDER — IBUPROFEN 800 MG PO TABS: 800.0000 mg | ORAL_TABLET | Freq: Three times a day (TID) | ORAL | Status: DC | PRN

## 2014-08-20 MED ORDER — OXYCODONE-ACETAMINOPHEN 5-325 MG PO TABS: 1.0000 | ORAL_TABLET | Freq: Four times a day (QID) | ORAL | Status: DC | PRN

## 2014-08-20 MED ORDER — SODIUM CHLORIDE 0.9 % IV SOLN
Freq: Once | INTRAVENOUS | Status: AC
  Administered 2014-08-20: 13:00:00 via INTRAVENOUS

## 2014-08-20 MED ORDER — IOHEXOL 300 MG/ML  SOLN
100.0000 mL | Freq: Once | INTRAMUSCULAR | Status: AC | PRN
  Administered 2014-08-20: 100 mL via INTRAVENOUS
  Filled 2014-08-20: qty 100

## 2014-08-20 MED ORDER — ONDANSETRON 4 MG PO TBDP: 4.0000 mg | ORAL_TABLET | Freq: Three times a day (TID) | ORAL | Status: DC | PRN

## 2014-08-20 NOTE — ED Provider Notes (Signed)
[facility] Emergency Department Provider Note  ____________________________________________  Time seen: Approximately 12:52 PM  I have reviewed the triage vital signs and the nursing notes.   HISTORY  Chief Complaint Pelvic Pain    HPI Michele Hansen is a 39 y.o. female presents to ER for complaints of pelvic pain as well as vaginal bleeding. Patient reports that in May of this year she had a D&C and reports that post D&C her menstrual cycle in June was normal. States onset of menstrual cycle in July was normal timing but bleeding has continued. States pelvic pain since menstrual in July and gradually worsening. States seen by her OBGYN this am Dr Greggory Keen and states had pelvic exam ( no swabs) but states since pelvic pain has increased. States her OBGYN was to schedule outpatient ultrasound but states presented to ER due to pain.   States given RX for percocet and took one last night which helped. States no pain medication today. STates pain is currently 7/10 and cramping throbbing pelvic pain. Denies abdominal pain. States history of endometriosis and PCOS and hx of painful menstruals but this is worse. Denies pain radiation. Denies vomiting, fever, nausea, diarrhea, abdominal pain. Reports continues to eat and drink well. States OBGYN changes oral contraceptive RX today to help stop bleeding, unsure of name of medication.   Denies other complaints. Denies any history of pregnancy. Denies changes in sexual partners. Denies dysuria, vaginal discharge or odor.    Past Medical History  Diagnosis Date  . Polycystic ovarian disease 2010  . Endometriosis 2014  . AR (allergic rhinitis)   . Irregular periods   . Yeast infection   . Infertility, female   . Fatigue   . Constipation   . Abnormal uterine bleeding (AUB)   . Painful menstrual periods   . Heavy periods   . Family history of breast cancer     Patient Active Problem List   Diagnosis Date Noted  .  Adenomyosis 08/20/2014  . Allergic state 08/20/2014  . Acute bronchospasm 08/20/2014  . Excessive and frequent menstruation with irregular cycle 08/20/2014  . Adiposity 06/04/2012  . Bilateral polycystic ovarian syndrome 06/04/2012  . Dysmenorrhea 01/20/2011    Past Surgical History  Procedure Laterality Date  . Mass excision N/A 2010    from uterus  . Cystectomy Right 2015    breast  . Hysteroscopy w/d&c N/A 05/13/2014    Procedure: DILATATION AND CURETTAGE /HYSTEROSCOPY;  Surgeon: Herold Harms, MD;  Location: [facility] ORS;  Service: Gynecology;  Laterality: N/A;  . Dilation and curettage of uterus    . Cryotherapy    . Breast surgery Bilateral 2004    breast reduction    Current Outpatient Rx  Name  Route  Sig  Dispense  Refill  . Cholecalciferol (VITAMIN D3) 2000 UNITS CHEW   Oral   Chew 2,000 mg by mouth every morning.         [name] ibuprofen (ADVIL,MOTRIN) 800 MG tablet   Oral   Take 1 tablet (800 mg total) by mouth 3 (three) times daily.   30 tablet   1   . ibuprofen (ADVIL,MOTRIN) 800 MG tablet   Oral   Take 1 tablet (800 mg total) by mouth every 8 (eight) hours as needed for moderate pain.   50 tablet   1   . loratadine (CLARITIN) 10 MG tablet   Oral   Take 10 mg by mouth daily as needed for allergies.         [name]  metFORMIN (GLUCOPHAGE) 500 MG tablet   Oral   Take 500 mg by mouth 2 (two) times daily with a meal.         . Norethindrone Acetate-Ethinyl Estradiol (JUNEL,LOESTRIN,MICROGESTIN) 1.5-30 MG-MCG tablet      3 pills a day for 3 days; then 2 pills a day for 2 days; then one pill daily. Do not take a placebo pills.   1 Package   11   . norethindrone-ethinyl estradiol (JUNEL FE 1/20) 1-20 MG-MCG tablet   Oral   Take 1 tablet by mouth daily. Pt to take continuously. 84/7 regimen.   3 Package   4   . oxyCODONE-acetaminophen (PERCOCET/ROXICET) 5-325 MG per tablet   Oral   Take 1-2 tablets by mouth every 6 (six) hours as needed.   30 tablet    0   . Prenatal Vit-Fe Fumarate-FA (MULTIVITAMIN-PRENATAL) 27-0.8 MG TABS tablet   Oral   Take 2 tablets by mouth daily at 12 noon.         . triamcinolone (NASACORT) 55 MCG/ACT AERO nasal inhaler   Nasal   Place 2 sprays into the nose daily.         . vitamin C (ASCORBIC ACID) 500 MG tablet   Oral   Take 500 mg by mouth every morning.           Allergies Banana; Latex; and Tape  Family History  Problem Relation Age of Onset  . Diabetes Father   . Colon cancer Father   . Depression Maternal Grandfather   . Breast cancer Paternal Grandmother   . Heart disease Neg Hx   . Ovarian cancer Neg Hx     Social History Social History  Substance Use Topics  . Smoking status: Never Smoker   . Smokeless tobacco: None  . Alcohol Use: No    Review of Systems Constitutional: No fever/chills Eyes: No visual changes. ENT: No sore throat. Cardiovascular: Denies chest pain. Respiratory: Denies shortness of breath. Gastrointestinal: No abdominal pain.  No nausea, no vomiting.  No diarrhea.  No constipation. Pelvic pain. Vaginal bleeding.  Genitourinary: Negative for dysuria. Musculoskeletal: Negative for back pain. Skin: Negative for rash. Neurological: Negative for headaches, focal weakness or numbness.  10-point ROS otherwise negative.  ____________________________________________   PHYSICAL EXAM:  VITAL SIGNS: ED Triage Vitals  Enc Vitals Group     BP 08/20/14 1119 131/85 mmHg     Pulse Rate 08/20/14 1119 72     Resp 08/20/14 1119 20     Temp 08/20/14 1119 97.6 F (36.4 C)     Temp Source 08/20/14 1119 Oral     SpO2 08/20/14 1119 100 %     Weight 08/20/14 1119 295 lb (133.811 kg)     Height 08/20/14 1119 5\' 8"  (1.727 m)     Head Cir --      Peak Flow --      Pain Score 08/20/14 1120 9     Pain Loc --      Pain Edu? --      Excl. in GC? --    Today's Vitals   08/20/14 1120 08/20/14 1605 08/20/14 1606 08/20/14 1848  BP:   152/67 126/80  Pulse:   65 87   Temp:   98.2 F (36.8 C)   TempSrc:   Oral   Resp:   18   Height:      Weight:      SpO2:   98% 100%  PainSc: 9  8  5     Constitutional: Alert and oriented. Well appearing and in no acute distress. Eyes: Conjunctivae are normal. PERRL. EOMI. Head: Atraumatic.  Nose: No congestion/rhinnorhea.  Mouth/Throat: Mucous membranes are moist.  Oropharynx non-erythematous. Neck: No stridor.  No cervical spine tenderness to palpation. Hematological/Lymphatic/Immunilogical: No cervical lymphadenopathy. Cardiovascular: Normal rate, regular rhythm. Grossly normal heart sounds.  Good peripheral circulation. Respiratory: Normal respiratory effort.  No retractions. Lungs CTAB. Gastrointestinal: Soft. Mild to mod generalized lower abdomen TTP, right greater than left. Obese abdomen. . Normal Bowel sounds.  No abdominal bruits. No CVA tenderness. Musculoskeletal: No lower or upper extremity tenderness nor edema.  No joint effusions. Bilateral pedal pulses equal and easily palpated. No cervical, thoracic or lumbar tenderness to palpation. Pelvic: with RN Olegario Messier at bedside.  External: normal, no rashes or lesions. Speculum: mild to mod active bleeding, no clots, cervix closed, no discharge. Bimanual: no cervical motion tenderness, bilateral adnexal tenderness right >left.  Neurologic:  Normal speech and language. No gross focal neurologic deficits are appreciated. No gait instability. Skin:  Skin is warm, dry and intact. No rash noted. Psychiatric: Mood and affect are normal. Speech and behavior are normal.  ____________________________________________   LABS (all labs ordered are listed, but only abnormal results are displayed)  Labs Reviewed  WET PREP, GENITAL - Abnormal; Notable for the following:    WBC, Wet Prep HPF POC FEW (*)    All other components within normal limits  URINALYSIS COMPLETEWITH MICROSCOPIC ([facility] ONLY) - Abnormal; Notable for the following:    Color, Urine YELLOW (*)     APPearance HAZY (*)    Hgb urine dipstick 3+ (*)    Protein, ur 30 (*)    Leukocytes, UA TRACE (*)    Squamous Epithelial / LPF 0-5 (*)    All other components within normal limits  CBC WITH DIFFERENTIAL/PLATELET - Abnormal; Notable for the following:    Hemoglobin 11.0 (*)    HCT 34.5 (*)    MCHC 31.8 (*)    RDW 15.9 (*)    All other components within normal limits  COMPREHENSIVE METABOLIC PANEL - Abnormal; Notable for the following:    CO2 21 (*)    All other components within normal limits  CHLAMYDIA/NGC RT PCR ([facility] ONLY)  URINE CULTURE  PREGNANCY, URINE    RADIOLOGY  TRANSABDOMINAL AND TRANSVAGINAL ULTRASOUND OF PELVIS  TECHNIQUE: Both transabdominal and transvaginal ultrasound examinations of the pelvis were performed. Transabdominal technique was performed for global imaging of the pelvis including uterus, ovaries, adnexal regions, and pelvic cul-de-sac. It was necessary to proceed with endovaginal exam following the transabdominal exam to visualize the endometrium and ovaries in greater detail.  COMPARISON: Pelvic ultrasound performed 07/27/2013  FINDINGS: Uterus  Measurements: 8.7 x 4.4 x 6.2 cm. A few small nonspecific cystic areas are seen within the myometrium. No mass seen.  Endometrium  Thickness: 0.6 cm. Not well defined.  Right ovary  Measurements: 3.9 x 1.8 x 3.4 cm. Normal appearance/no adnexal mass.  Left ovary  Measurements: 2.8 x 1.8 x 1.9 cm. Normal appearance/no adnexal mass.  Other findings  A small amount of free fluid is seen within the pelvic cul-de-sac.  IMPRESSION: 1. No acute abnormality seen to explain the patient's symptoms. No evidence for ovarian torsion. 2. Few small nonspecific cystic areas within the myometrium. No evidence of mass. Endometrium not well defined.   Electronically Signed By: Roanna Raider M.D. On: 08/20/2014 18:07          US Transvaginal  Non-OB (Final result) Result time:  08/20/14 18:07:51   Final result by Rad Results In Interface (08/20/14 18:07:51)   Narrative:   CLINICAL DATA: Acute onset of pelvic pain. Initial encounter.  EXAM: TRANSABDOMINAL AND TRANSVAGINAL ULTRASOUND OF PELVIS  TECHNIQUE: Both transabdominal and transvaginal ultrasound examinations of the pelvis were performed. Transabdominal technique was performed for global imaging of the pelvis including uterus, ovaries, adnexal regions, and pelvic cul-de-sac. It was necessary to proceed with endovaginal exam following the transabdominal exam to visualize the endometrium and ovaries in greater detail.  COMPARISON: Pelvic ultrasound performed 07/27/2013  FINDINGS: Uterus  Measurements: 8.7 x 4.4 x 6.2 cm. A few small nonspecific cystic areas are seen within the myometrium. No mass seen.  Endometrium  Thickness: 0.6 cm. Not well defined.  Right ovary  Measurements: 3.9 x 1.8 x 3.4 cm. Normal appearance/no adnexal mass.  Left ovary  Measurements: 2.8 x 1.8 x 1.9 cm. Normal appearance/no adnexal mass.  Other findings  A small amount of free fluid is seen within the pelvic cul-de-sac.  IMPRESSION: 1. No acute abnormality seen to explain the patient's symptoms. No evidence for ovarian torsion. 2. Few small nonspecific cystic areas within the myometrium. No evidence of mass. Endometrium not well defined.   Electronically Signed By: Roanna Raider M.D. On: 08/20/2014 18:07   CT Abdomen Pelvis W Contrast (Final result) Result time: 08/20/14 21:27:53   Procedure changed from CT Abdomen Pelvis W Wo Contrast      Final result by Rad Results In Interface (08/20/14 21:27:53)   Narrative:   CLINICAL DATA: Pelvic pain.  EXAM: CT ABDOMEN AND PELVIS WITH CONTRAST  TECHNIQUE: Multidetector CT imaging of the abdomen and pelvis was performed using the standard protocol following bolus administration of intravenous contrast.  CONTRAST: OMNIPAQUE IOHEXOL  300 MG/ML SOLN  COMPARISON: 08/20/2014 pelvic ultrasound  FINDINGS: Lower chest: Unremarkable  Hepatobiliary: Unremarkable  Pancreas: Unremarkable  Spleen: Unremarkable  Adrenals/Urinary Tract: Unremarkable  Stomach/Bowel: Unremarkable. Appendix normal.  Vascular/Lymphatic: Unremarkable  Reproductive: Uterine and ovarian contours normal.  Other: Unremarkable  Musculoskeletal: Unremarkable  IMPRESSION: 1. Normal CT appearance of the abdomen and pelvis. Some entities which can cause pelvic pain, such as endometriosis, might not be visible at imaging.   Electronically Signed By: Gaylyn Rong M.D. On: 08/20/2014 21:27     I, Renford Dills, personally viewed and evaluated these images (plain radiographs) as part of my medical decision making.  ____________________________________________    INITIAL IMPRESSION / ASSESSMENT AND PLAN / ED COURSE  Pertinent labs & imaging results that were available during my care of the patient were reviewed by me and considered in my medical decision making (see chart for details).  Very well-appearing patient. No acute distress. Presents the ER for complaints of pelvic pain and vaginal bleeding. Seen by OB/GYN this morning. Patient states that after her pelvic exam at her OB/GYN's office that her pelvic pain increased. Patient states the pelvic pain is the same locations as it has been since July of menstrual cycle but has continued in gradually worsened. Patient presents for evaluation of pain.  1410: reports pain now 4/10. Awaiting ultrasound.  1540: No acute distress. Resting calmly in bed. Patient states pain is currently 6 out of 10. Still awaiting ultrasound. 1700: Continuing to await ultrasound. Patient resting calmly in bed and verbalized understanding of current plan of care.  1815: Pelvic ultrasound results showing no acute abnormality seen, no evidence of ovarian torsion, few small nonspecific cystic areas  within the myometrium. No evidence  of mass, endometrium not well defined. Discussed these findings with OB/GYN Dr. Greggory Keen. Dr. Greggory Keen suspects patient with adenomyosis endometriosis and wants to continue with current outpatient plan including percocet and zofran prn as well as his change he made today to oral contraceptive. Reports changed her to [facility] for bleeding control. Reports will follow up with patient outpatient next week.   Due to negative ultrasound, abdomen reexamined with continued generalized lower abdominal TTP, will evaluate by CT. patient verbalized understanding of this plan of care and requested to go forward with CT of abdomen.   1930: awaiting CT abdomen. REports pelvic pain currently 5/10. Denies nausea. Resting calmly in bed.   2045: awaiting CT.   2135: CT abdomen reviewed. Normal CT appearance of the abdomen and pelvis. Discussed this with patient. Patient to follow up with OB/GYN Dr. Greggory Keen. Patient to call OB/GYN tomorrow to follow up. Patient to continue to change oral contraceptive as prescribed today by OB/GYN. As discussed with Dr. Greggory Keen, suspect adenomyosis and endometriosis. Patient to continue when necessary Percocet  which she has. Denies need for additional prescriptions except zofran for nausea. Very well-appearing patient. Tolerating by mouth fluids in the ER. Afebrile. Discussed strict follow-up and return parameters. Patient verbalized understanding and agreed to plan.  ____________________________________________   FINAL CLINICAL IMPRESSION(S) / ED DIAGNOSES  Final diagnoses:    Pelvic pain in female  Abdominal pain, unspecified abdominal location  Endometriosis  Menometrorrhagia       Renford Dills, NP 08/20/14 2145  Renford Dills, NP 08/20/14 2147  Loleta Rose, MD 08/20/14 2205

## 2014-08-20 NOTE — ED Notes (Signed)
Has had chronic pelvic pain,has had vag bleeding and abd cramps for several weeks, during pelvic exam had a lot of pain,

## 2014-08-20 NOTE — Patient Instructions (Signed)
1.  Ibuprofen 800 mg 3 times a day 2.  Percocet 5/325 one to 2 tablets every 6 hours as needed. 3.  Ultrasound-first available. 4.  Return in 2 weeks for follow-up. 5.  Junel 1.5/30-3 pills a day for 3 days; then 2 pills a day for 2 days; then daily thereafter.  Do not take placebo pills.

## 2014-08-20 NOTE — Discharge Instructions (Signed)
Take medication as prescribed by your OBGYN. Continue home pain medication as needed. Rest. Drink plenty of fluids.   Call tomorrow to follow up with your OBGYN in 1-2 days.Close follow up is very important.   Return to ER for pain not responding to medication, fever, inability to tolerate food or fluids, new or worsening concerns.   Abdominal Pain Many things can cause abdominal pain. Usually, abdominal pain is not caused by a disease and will improve without treatment. It can often be observed and treated at home. Your health care provider will do a physical exam and possibly order blood tests and X-rays to help determine the seriousness of your pain. However, in many cases, more time must pass before a clear cause of the pain can be found. Before that point, your health care provider may not know if you need more testing or further treatment. HOME CARE INSTRUCTIONS  Monitor your abdominal pain for any changes. The following actions may help to alleviate any discomfort you are experiencing:  Only take over-the-counter or prescription medicines as directed by your health care provider.  Do not take laxatives unless directed to do so by your health care provider.  Try a clear liquid diet (broth, tea, or water) as directed by your health care provider. Slowly move to a bland diet as tolerated. SEEK MEDICAL CARE IF:  You have unexplained abdominal pain.  You have abdominal pain associated with nausea or diarrhea.  You have pain when you urinate or have a bowel movement.  You experience abdominal pain that wakes you in the night.  You have abdominal pain that is worsened or improved by eating food.  You have abdominal pain that is worsened with eating fatty foods.  You have a fever. SEEK IMMEDIATE MEDICAL CARE IF:   Your pain does not go away within 2 hours.  You keep throwing up (vomiting).  Your pain is felt only in portions of the abdomen, such as the right side or the left lower  portion of the abdomen.  You pass bloody or black tarry stools. MAKE SURE YOU:  Understand these instructions.   Will watch your condition.   Will get help right away if you are not doing well or get worse.  Document Released: 09/30/2004 Document Revised: 12/26/2012 Document Reviewed: 08/30/2012 [facility] Patient Information 2015 [place], [place]. This information is not intended to replace advice given to you by your health care provider. Make sure you discuss any questions you have with your health care provider.  Pelvic Pain Female pelvic pain can be caused by many different things and start from a variety of places. Pelvic pain refers to pain that is located in the lower half of the abdomen and between your hips. The pain may occur over a short period of time (acute) or may be reoccurring (chronic). The cause of pelvic pain may be related to disorders affecting the female reproductive organs (gynecologic), but it may also be related to the bladder, kidney stones, an intestinal complication, or muscle or skeletal problems. Getting help right away for pelvic pain is important, especially if there has been severe, sharp, or a sudden onset of unusual pain. It is also important to get help right away because some types of pelvic pain can be life threatening.  CAUSES  Below are only some of the causes of pelvic pain. The causes of pelvic pain can be in one of several categories.   Gynecologic.  Pelvic inflammatory disease.  Sexually transmitted infection.  Ovarian  cyst or a twisted ovarian ligament (ovarian torsion).  Uterine lining that grows outside the uterus (endometriosis).  Fibroids, cysts, or tumors.  Ovulation.  Pregnancy.  Pregnancy that occurs outside the uterus (ectopic pregnancy).  Miscarriage.  Labor.  Abruption of the placenta or ruptured uterus.  Infection.  Uterine infection (endometritis).  Bladder infection.  Diverticulitis.  Miscarriage related to a  uterine infection (septic abortion).  Bladder.  Inflammation of the bladder (cystitis).  Kidney stone(s).  Gastrointestinal.  Constipation.  Diverticulitis.  Neurologic.  Trauma.  Feeling pelvic pain because of mental or emotional causes (psychosomatic).  Cancers of the bowel or pelvis. EVALUATION  Your caregiver will want to take a careful history of your concerns. This includes recent changes in your health, a careful gynecologic history of your periods (menses), and a sexual history. Obtaining your family history and medical history is also important. Your caregiver may suggest a pelvic exam. A pelvic exam will help identify the location and severity of the pain. It also helps in the evaluation of which organ system may be involved. In order to identify the cause of the pelvic pain and be properly treated, your caregiver may order tests. These tests may include:   A pregnancy test.  Pelvic ultrasonography.  An X-ray exam of the abdomen.  A urinalysis or evaluation of vaginal discharge.  Blood tests. HOME CARE INSTRUCTIONS   Only take over-the-counter or prescription medicines for pain, discomfort, or fever as directed by your caregiver.   Rest as directed by your caregiver.   Eat a balanced diet.   Drink enough fluids to make your urine clear or pale yellow, or as directed.   Avoid sexual intercourse if it causes pain.   Apply warm or cold compresses to the lower abdomen depending on which one helps the pain.   Avoid stressful situations.   Keep a journal of your pelvic pain. Write down when it started, where the pain is located, and if there are things that seem to be associated with the pain, such as food or your menstrual cycle.  Follow up with your caregiver as directed.  SEEK MEDICAL CARE IF:  Your medicine does not help your pain.  You have abnormal vaginal discharge. SEEK IMMEDIATE MEDICAL CARE IF:   You have heavy bleeding from the vagina.    Your pelvic pain increases.   You feel light-headed or faint.   You have chills.   You have pain with urination or blood in your urine.   You have uncontrolled diarrhea or vomiting.   You have a fever or persistent symptoms for more than 3 days.  You have a fever and your symptoms suddenly get worse.   You are being physically or sexually abused.  MAKE SURE YOU:  Understand these instructions.  Will watch your condition.  Will get help if you are not doing well or get worse. Document Released: 11/18/2003 Document Revised: 05/07/2013 Document Reviewed: 04/12/2011 [facility] Patient Information 2015 [place], [place]. This information is not intended to replace advice given to you by your health care provider. Make sure you discuss any questions you have with your health care provider.  Dysfunctional Uterine Bleeding Normally, menstrual periods begin between ages 48 to 20 in young women. A normal menstrual cycle/period may begin every 23 days up to 35 days and lasts from 1 to 7 days. Around 12 to 14 days before your menstrual period starts, ovulation (ovary produces an egg) occurs. When counting the time between menstrual periods, count from the  first day of bleeding of the previous period to the first day of bleeding of the next period. Dysfunctional (abnormal) uterine bleeding is bleeding that is different from a normal menstrual period. Your periods may come earlier or later than usual. They may be lighter, have blood clots or be heavier. You may have bleeding between periods, or you may skip one period or more. You may have bleeding after sexual intercourse, bleeding after menopause, or no menstrual period. CAUSES   Pregnancy (normal, miscarriage, tubal).  IUDs (intrauterine device, birth control).  Birth control pills.  Hormone treatment.  Menopause.  Infection of the cervix.  Blood clotting problems.  Infection of the inside lining of the  uterus.  Endometriosis, inside lining of the uterus growing in the pelvis and other female organs.  Adhesions (scar tissue) inside the uterus.  Obesity or severe weight loss.  Uterine polyps inside the uterus.  Cancer of the vagina, cervix, or uterus.  Ovarian cysts or polycystic ovary syndrome.  Medical problems (diabetes, thyroid disease).  Uterine fibroids (noncancerous tumor).  Problems with your female hormones.  Endometrial hyperplasia, very thick lining and enlarged cells inside of the uterus.  Medicines that interfere with ovulation.  Radiation to the pelvis or abdomen.  Chemotherapy. DIAGNOSIS   Your doctor will discuss the history of your menstrual periods, medicines you are taking, changes in your weight, stress in your life, and any medical problems you may have.  Your doctor will do a physical and pelvic examination.  Your doctor may want to perform certain tests to make a diagnosis, such as:  Pap test.  Blood tests.  Cultures for infection.  CT scan.  Ultrasound.  Hysteroscopy.  Laparoscopy.  MRI.  Hysterosalpingography.  D and C.  Endometrial biopsy. TREATMENT  Treatment will depend on the cause of the dysfunctional uterine bleeding (DUB). Treatment may include:  Observing your menstrual periods for a couple of months.  Prescribing medicines for medical problems, including:  Antibiotics.  Hormones.  Birth control pills.  Removing an IUD (intrauterine device, birth control).  Surgery:  D and C (scrape and remove tissue from inside the uterus).  Laparoscopy (examine inside the abdomen with a lighted tube).  Uterine ablation (destroy lining of the uterus with electrical current, laser, heat, or freezing).  Hysteroscopy (examine cervix and uterus with a lighted tube).  Hysterectomy (remove the uterus). HOME CARE INSTRUCTIONS   If medicines were prescribed, take exactly as directed. Do not change or switch medicines without  consulting your caregiver.  Long term heavy bleeding may result in iron deficiency. Your caregiver may have prescribed iron pills. They help replace the iron that your body lost from heavy bleeding. Take exactly as directed.  Do not take aspirin or medicines that contain aspirin one week before or during your menstrual period. Aspirin may make the bleeding worse.  If you need to change your sanitary pad or tampon more than once every 2 hours, stay in bed with your feet elevated and a cold pack on your lower abdomen. Rest as much as possible, until the bleeding stops or slows down.  Eat well-balanced meals. Eat foods high in iron. Examples are:  Leafy green vegetables.  Whole-grain breads and cereals.  Eggs.  Meat.  Liver.  Do not try to lose weight until the abnormal bleeding has stopped and your blood iron level is back to normal. Do not lift more than ten pounds or do strenuous activities when you are bleeding.  For a couple of months, make  note on your calendar, marking the start and ending of your period, and the type of bleeding (light, medium, heavy, spotting, clots or missed periods). This is for your caregiver to better evaluate your problem. SEEK MEDICAL CARE IF:   You develop nausea (feeling sick to your stomach) and vomiting, dizziness, or diarrhea while you are taking your medicine.  You are getting lightheaded or weak.  You have any problems that may be related to the medicine you are taking.  You develop pain with your DUB.  You want to remove your IUD.  You want to stop or change your birth control pills or hormones.  You have any type of abnormal bleeding mentioned above.  You are over 35 years old and have not had a menstrual period yet.  You are 39 years old and you are still having menstrual periods.  You have any of the symptoms mentioned above.  You develop a rash. SEEK IMMEDIATE MEDICAL CARE IF:   An oral temperature above 102 F (38.9 C)  develops.  You develop chills.  You are changing your sanitary pad or tampon more than once an hour.  You develop abdominal pain.  You pass out or faint. Document Released: 12/19/1999 Document Revised: 03/15/2011 Document Reviewed: 11/19/2008 [facility] Patient Information 2015 [place], [place]. This information is not intended to replace advice given to you by your health care provider. Make sure you discuss any questions you have with your health care provider.

## 2014-08-20 NOTE — ED Notes (Signed)
Pt to ed with c/o pelvic pain since starting period in July.  Pt states she was seen at [facility] today and had vaginal exam. Pt states after the exam the pain was unbearable and she had to come here and be seen for the pain.  Pt states she received RX for pain meds but couldn't drive to get them.

## 2014-08-20 NOTE — Addendum Note (Signed)
Addended by: Marchelle Folks on: 08/20/2014 10:09 AM   Modules accepted: Orders

## 2014-08-20 NOTE — Progress Notes (Signed)
Patient ID: Michele Hansen, female   DOB: June 21, 1975, 39 y.o.   MRN: [id number] 3 month f/u on aub  Started junel 1/20 1 month ago Cycles and cramps are heavy and painful Taking ibup and percocet S/p hysteroscopy d &c 05/2014   Chief complaint: 1.  Abnormal uterine bleeding on OCPs. 2.  History of adenomyosis/endometriosis. 3.  Menorrhagia. 4.  Status post hysteroscopy/D&C.  05/2014  SUBJECTIVE: The patient states that she had a normal cycle in June 2016, 1 month following her hysteroscopy/D&C.  Since that time, she has experienced heavy irregular bleeding with clots and severe pelvic pain being and pain, rated as a 12 on a 1-10.  Basis.  She has been having to take Percocet and ibuprofen for control of symptoms.  OBJECTIVE: BP 122/85 mmHg  Pulse 76  Temp(Src) 98.4 F (36.9 C)  Ht  (1.727 m)  Wt 295 lb 4.8 oz (133.947 kg)  BMI 44.91 kg/m2  LMP 07/29/2014 (Exact Date) Pleasant but uncomfortable female. Flank: No CVA tenderness. Abdomen: Soft, mild tenderness in lower quadrants, without peritoneal signs or organomegaly. Pelvic exam: Bloodstained peritoneum; burgundy clots in vaginal vault; mild cervical motion tenderness; uterus, 3/4 tender; adnexa-right side, 3/4 tender; left side.  1/4 tender; external rectal exam normal.  IMPRESSION: 1.  Menometrorrhagia. 2.  History of adenomyosis/endometriosis. 3.  Worsening pelvic pain, midline and right lower quadrant. 4.  Endometriosis refractory to low-dose OCP.  PLAN: 1.  Pelvic ultrasound. 2.  Percocet and Motrin for pain. 3.  Return 2 weeks for follow-up. 4.  Change OCP regimen to Junel 1.5/30-3.  3 pills a day for 3 days; 2 pills a day for 2 days; 1 pill thereafter.  Do not take placebo pills

## 2014-08-21 ENCOUNTER — Ambulatory Visit: Payer: Self-pay | Admitting: Obstetrics and Gynecology

## 2014-08-21 ENCOUNTER — Telehealth: Payer: Self-pay | Admitting: Obstetrics and Gynecology

## 2014-08-21 NOTE — Telephone Encounter (Signed)
Pt called after she left yesterday from our office and was in really bad pain, stated she was going to go ahead and go to the ER.

## 2014-08-21 NOTE — Telephone Encounter (Signed)
Pt was seen in ER last nite d/t pelvic pain and bleeding.Had b/w,pelvic exam, u/s, and ct scan. ALL wnl. Pain and bleeding caused by her endometriosis. ER dr spoke with mad. Pt is taking percocet 1-2 q 6 and ibup q8. Pt call back and schedule her 2 week f/u appt. If needed she will be seen in 1 week.

## 2014-08-22 ENCOUNTER — Other Ambulatory Visit: Payer: Self-pay | Admitting: Obstetrics and Gynecology

## 2014-08-23 LAB — URINE CULTURE

## 2014-08-28 ENCOUNTER — Telehealth: Payer: Self-pay | Admitting: Obstetrics and Gynecology

## 2014-08-28 NOTE — Telephone Encounter (Signed)
Pt was told to callback and let you know how she is doing. She needs to talk to you about something personal.

## 2014-08-29 ENCOUNTER — Telehealth: Payer: Self-pay | Admitting: Obstetrics and Gynecology

## 2014-08-29 NOTE — Telephone Encounter (Signed)
She wanted to know about the medication that dr de gave her she wanted to know if she needs to take it continuously its the ocps, and she wanted to know if she needed to make a f/u appt.

## 2014-08-30 NOTE — Telephone Encounter (Signed)
Spoke with pt. See previous message.

## 2014-08-30 NOTE — Telephone Encounter (Signed)
Pt aware to take pills continuously. F/u appt made for 8/31 Pt is feeling a lot better.[name]

## 2014-09-04 ENCOUNTER — Encounter: Payer: Self-pay | Admitting: Obstetrics and Gynecology

## 2014-09-04 ENCOUNTER — Ambulatory Visit (INDEPENDENT_AMBULATORY_CARE_PROVIDER_SITE_OTHER): Payer: PRIVATE HEALTH INSURANCE | Admitting: Obstetrics and Gynecology

## 2014-09-04 VITALS — BP 127/88 | HR 85 | Ht 68.0 in | Wt 293.1 lb

## 2014-09-04 DIAGNOSIS — N921 Excessive and frequent menstruation with irregular cycle: Secondary | ICD-10-CM | POA: Diagnosis not present

## 2014-09-04 DIAGNOSIS — N809 Endometriosis, unspecified: Secondary | ICD-10-CM

## 2014-09-04 DIAGNOSIS — N8003 Adenomyosis of the uterus: Secondary | ICD-10-CM

## 2014-09-04 DIAGNOSIS — N800 Endometriosis of uterus: Secondary | ICD-10-CM | POA: Diagnosis not present

## 2014-09-04 DIAGNOSIS — R102 Pelvic and perineal pain: Secondary | ICD-10-CM | POA: Diagnosis not present

## 2014-09-04 NOTE — Progress Notes (Signed)
Chief complaint: 1.  Abnormal uterine bleeding. 2.  Pelvic pain. 3.  Possible adenomyosis.  Patient presents for follow-up conference regarding above issues.  Since her last visit, she had severe exacerbation of pain and abnormal uterine bleeding, which was treated with the " OVRAL regimen."She took 3 pills a day for 3 days, followed by 2 pills a day for 2 days, followed by one pill daily.  Her bleeding has been suppressed.  Pelvic pain has diminished significantly.  Ultrasound performed in the emergency room on 8 exchange 2016 was notable for small amount of free fluid in the cul-de-sac, few small cystic areas within the myometrium (possibly consistent with adenomyosis).  Overall, patient is feeling better at this time.  IMPRESSION: 1.  Abnormal uterine bleeding, resolved with change in oral contraceptive to a higher dose pill. 2.  Pelvic pain, improved. 3.  Pelvic ultrasound and patient's symptoms and history are very consistent with probable adenomyosis.  PLAN: 1.  Continue with daily OCPs without a break. 2.  Monitor symptoms for worsening pain or abnormal uterine bleeding. 3.  Continue with ibuprofen 800 mg 3 times a day for pain with the addition of Percocet if the pain is refractory to the nonsteroidal. 4.  Return in 3 months for follow-up. 5.  If symptoms do not respond to this plan, patient will consider Lupron therapy.  A total of 15 minutes were spent face-to-face with the patient during this encounter and over half of that time dealt with counseling and coordination of care.  Herold Harms, MD

## 2014-09-04 NOTE — Patient Instructions (Signed)
1.  Continue taking the Junel1.5/30 birth control pill continuously. 2.  Monitor symptoms for abnormal uterine bleeding and exacerbation of pain. 3.  Motrin 800 mg 3 times a day as needed for discomfort,. 4.  Return 3 months for follow-up.

## 2014-10-17 NOTE — Telephone Encounter (Signed)
ERROR

## 2014-10-28 ENCOUNTER — Telehealth: Payer: Self-pay

## 2014-10-28 NOTE — Telephone Encounter (Signed)
Pt is considering a skyla or mirena for the aub. She has an appt 11/30. Aware she will need to be no sex for 2 weeks prior to insertion. Will ask mad and get the ball rolling for approval thru insurance.

## 2014-11-07 NOTE — Telephone Encounter (Signed)
Ok per mad- will get [facility] to approve thru insurance-

## 2014-11-13 ENCOUNTER — Ambulatory Visit: Payer: Self-pay | Admitting: Physician Assistant

## 2014-11-13 ENCOUNTER — Encounter: Payer: Self-pay | Admitting: Physician Assistant

## 2014-11-13 VITALS — BP 120/80 | HR 80 | Temp 98.1°F

## 2014-11-13 DIAGNOSIS — G500 Trigeminal neuralgia: Secondary | ICD-10-CM

## 2014-11-13 MED ORDER — METHYLPREDNISOLONE 4 MG PO TBPK: ORAL_TABLET | ORAL | Status: DC

## 2014-11-13 NOTE — Progress Notes (Signed)
S: c/o r sided cheek, jaw pain, no known injury, pain when bites down hard, saw dentist in Aug and didn't have any problems with her teeth, xrays normal, no fever/chills  O: vitals wnl, nad, tms clear, nasal mucosa wnl, throat wnl, pain reproduced by pulling cheek with tongue blade, neck supple no lymph, lungs c t a, cv rrr  A: trigeminal neuralgia  P: medrol dose pack

## 2014-11-15 NOTE — Telephone Encounter (Signed)
Contacted insurance. Per Cyril Mourning @ [employer] IUD is subject to deductible and coinsurance; however patient has met deductible and OOP at 100% so IUD will be covered at 100% ref # [id number]. Patient notified.-KEC

## 2014-12-04 ENCOUNTER — Encounter: Payer: Self-pay | Admitting: Obstetrics and Gynecology

## 2014-12-04 ENCOUNTER — Ambulatory Visit (INDEPENDENT_AMBULATORY_CARE_PROVIDER_SITE_OTHER): Payer: PRIVATE HEALTH INSURANCE | Admitting: Obstetrics and Gynecology

## 2014-12-04 VITALS — BP 124/96 | HR 77 | Ht 68.0 in | Wt 294.2 lb

## 2014-12-04 DIAGNOSIS — N809 Endometriosis, unspecified: Secondary | ICD-10-CM

## 2014-12-04 DIAGNOSIS — Z30430 Encounter for insertion of intrauterine contraceptive device: Secondary | ICD-10-CM | POA: Diagnosis not present

## 2014-12-04 DIAGNOSIS — R102 Pelvic and perineal pain: Secondary | ICD-10-CM

## 2014-12-04 DIAGNOSIS — N8003 Adenomyosis of the uterus: Secondary | ICD-10-CM

## 2014-12-04 DIAGNOSIS — N800 Endometriosis of uterus: Secondary | ICD-10-CM

## 2014-12-04 DIAGNOSIS — N921 Excessive and frequent menstruation with irregular cycle: Secondary | ICD-10-CM | POA: Diagnosis not present

## 2014-12-04 LAB — POCT URINE PREGNANCY: PREG TEST UR: NEGATIVE

## 2014-12-04 NOTE — Patient Instructions (Addendum)
1.  Urine pregnancy test is negative. 2.  Options of management of abnormal uterine bleeding with NuvaRing and Mirena IUD were reviewed. 3.  Mirena IUD insertion was accomplished.  IUD string measures 3 cm 4.  Take Advil and Tylenol as needed for cramping. 5.  Return in 4 weeks for IUD string check.

## 2014-12-04 NOTE — Progress Notes (Signed)
Chief complaint: 1.  Abnormal uterine bleeding. 2.  Pelvic pain. 3.  Possible adenomyosis.  Patient presents for follow-up on above issues.  She has been placed on a higher dose oral contraceptive with improvement in bleeding pattern.  She still has cyclic bleeding with occasional spotting.  Pelvic cramping is diminished.  However, the patient is having difficulty remembering taking pills on a daily basis.  She would like to proceed with a trial of the Mirena IUD for regulation of her abnormal uterine bleeding. Previous ultrasound findings were suspicious for adenomyosis. Patient has undergone Hysteroscopy, D&C in the past without resolution of her abnormal uterine bleeding.  Past medical history, past surgical history, problems, medications, and allergies are reviewed.  Review of systems: Per HPI.  OBJECTIVE: BP 124/96 mmHg  Pulse 77  Ht 5\' 8"  (1.727 m)  Wt 294 lb 3 oz (133.443 kg)  BMI 44.74 kg/m2  LMP 11/13/2014 Pleasant, well-appearing African female in no acute distress.  She is alert and oriented. Abdomen: Soft, nontender, without organomegaly. Pelvic exam: External genitalia-Normal BUS-Normal Vagina-Normal Cervix-Normal; parous; no cervical motion tenderness Uterus-Midplane, normal size and shape, mobile, nontender Adnexa-Nonpalpable, no masses RV-Normal external exam  IUD Insertion Procedure Note  Pre-operative Diagnosis:  1.  Abnormal uterine bleeding. 2.  Health pain. 3.  History of endometriosis/adenomyosis  Post-operative Diagnosis: same  Indications: contraception/AUB control  Procedure Details  Urine pregnancy test was done  and result was Negative.  The risks (including infection, bleeding, pain, and uterine perforation) and benefits of the procedure were explained to the patient and Verbal informed consent was obtained.    Cervix cleansed with Betadine. Uterus sounded to 8 cm. IUD inserted without difficulty. String visible and trimmed. Patient tolerated  procedure well.  IUD Information: Mirena, Lot # [id number], Expiration date 6 /2019 Condition: Stable  Complications: None  Plan: 1.  Return in 4 weeks for IUD string check 2.The patient was advised to call for any fever or for prolonged or severe pain or bleeding. She was advised to use OTC acetaminophen and OTC ibuprofen as needed for mild to moderate pain.   Attending Physician Documentation: Herold HarmsMartin A Joyce Heitman, MD  ASSESSMENT: 1.  Abnormal uterine bleeding refractory to hysteroscopy/D&C and 20 g birth control pills.  Some improvement on 30 g pills, but patient unable to remember to be consistent with pill taking. 2.  Patient declines NuvaRing trial, which would offset the problem with remembering to take pills. 3.  Patient opened to IUD management of abnormal uterine bleeding.  PLAN: 1.  Mirena IUD insertion. 2.  Tylenol/nonsteroidal medication when necessary 3.  Menstrual calendar monitoring. 4.  Return in 4 weeks for recheck  A total of 15 minutes were spent face-to-face with the patient during this encounter and over half of that time dealt with counseling and coordination of care.  Herold HarmsMartin A Kennidee Heyne, MD  Note: This dictation was prepared with Dragon dictation along with smaller phrase technology. Any transcriptional errors that result from this process are unintentional.

## 2014-12-09 ENCOUNTER — Telehealth: Payer: Self-pay | Admitting: Obstetrics and Gynecology

## 2014-12-09 NOTE — Telephone Encounter (Signed)
Pt is going out of town in 2 wks on a cruise and wants to know if you can send her the Scope Patch for nausea!!!  [facility] [place]

## 2014-12-11 MED ORDER — SCOPOLAMINE 1 MG/3DAYS TD PT72: 1.0000 | MEDICATED_PATCH | TRANSDERMAL | Status: DC

## 2014-12-11 NOTE — Telephone Encounter (Signed)
Per mad med erx. Pt aware.

## 2015-01-08 ENCOUNTER — Ambulatory Visit (INDEPENDENT_AMBULATORY_CARE_PROVIDER_SITE_OTHER): Payer: Managed Care, Other (non HMO) | Admitting: Obstetrics and Gynecology

## 2015-01-08 ENCOUNTER — Encounter: Payer: Self-pay | Admitting: Obstetrics and Gynecology

## 2015-01-08 VITALS — BP 123/75 | HR 81 | Wt 294.0 lb

## 2015-01-08 DIAGNOSIS — N809 Endometriosis, unspecified: Secondary | ICD-10-CM

## 2015-01-08 DIAGNOSIS — Z30431 Encounter for routine checking of intrauterine contraceptive device: Secondary | ICD-10-CM | POA: Diagnosis not present

## 2015-01-08 DIAGNOSIS — N800 Endometriosis of uterus: Secondary | ICD-10-CM | POA: Diagnosis not present

## 2015-01-08 DIAGNOSIS — R102 Pelvic and perineal pain: Secondary | ICD-10-CM | POA: Diagnosis not present

## 2015-01-08 DIAGNOSIS — N921 Excessive and frequent menstruation with irregular cycle: Secondary | ICD-10-CM

## 2015-01-08 DIAGNOSIS — N8003 Adenomyosis of the uterus: Secondary | ICD-10-CM

## 2015-01-08 NOTE — Patient Instructions (Signed)
1.  Continue using the menstrual calendar, monitoring for abnormal uterine bleeding. 2.  Return in 6 months for follow-up on pelvic pain and abnormal uterine bleeding. 3.  The Mirena IUD is in appropriate location on exam today.

## 2015-01-08 NOTE — Progress Notes (Signed)
Chief complaint: 1.  IUD string check.  Patient presents for 4 week follow-up after IUD insertion.  Patient reports diminished pain and no bleeding.  She is pleased with the effects so far of the Mirena IUD.  Past Medical history, past surgical history, medications, and allergies are reviewed.  OBJECTIVE: BP 123/75 mmHg  Pulse 81  Wt 294 lb (133.358 kg) Pleasant female in no acute distress.  Alert and oriented. Abdomen: Soft, nontender. Pelvic exam: Surgeon Terry-normal BUS-normal Vagina-normal, with minimal brownish discharge in the vault consistent with old blood Cervix-scarred (old); IUD string 3 cm; no lesions. Bimanual-not done.  ASSESSMENT:  1. Abnormal uterine bleeding, Improved  2. Pelvic pain, Improved 3. Possible adenomyosis 4.  Status post Mirena IUD insertion; normal IUD string check.  PLAN: 1.  Menstrual calendar, monitoring 2.  Return in 6 months for follow-up on pelvic pain, abnormal uterine bleeding.  Herold HarmsMartin A Saree Krogh, MD  Note: This dictation was prepared with Dragon dictation along with smaller phrase technology. Any transcriptional errors that result from this process are unintentional.

## 2015-03-13 ENCOUNTER — Encounter: Payer: Self-pay | Admitting: Obstetrics and Gynecology

## 2015-03-24 ENCOUNTER — Ambulatory Visit: Payer: Self-pay | Admitting: Physician Assistant

## 2015-03-24 ENCOUNTER — Encounter: Payer: Self-pay | Admitting: Physician Assistant

## 2015-03-24 VITALS — BP 119/85 | HR 76 | Temp 98.5°F

## 2015-03-24 DIAGNOSIS — M25562 Pain in left knee: Secondary | ICD-10-CM

## 2015-03-24 MED ORDER — VITAMIN D (ERGOCALCIFEROL) 1.25 MG (50000 UNIT) PO CAPS: 50000.0000 [IU] | ORAL_CAPSULE | ORAL | Status: DC

## 2015-03-24 MED ORDER — DICLOFENAC SODIUM 1 % TD GEL: 4.0000 g | Freq: Four times a day (QID) | TRANSDERMAL | Status: DC

## 2015-03-24 NOTE — Progress Notes (Signed)
S: c/o left knee pain, increased after walking for awhile, after sitting for long period of time then rising to stand, some grinding, no popping/clicking, no known injury, has been working out for weight loss and knee has gotten worse after running/walking on concrete, denies numbness/tingling  O: vitals wnl, nad, left knee tender at patella, full rom, some grinding on extension, n/v intact  A: acute left knee pain, patella-femoral syndrome  P: voltaren gel, turmeric, exercises explained and demonstrated, ice, avoid deep squats, treadmill, recommend water aerobics and exercises that do not cause hard impact, will refer to ortho if not improving in 2 -4 weeks

## 2015-05-01 ENCOUNTER — Other Ambulatory Visit: Payer: Self-pay | Admitting: Physician Assistant

## 2015-05-02 NOTE — Telephone Encounter (Signed)
Med refill approved 

## 2015-06-25 ENCOUNTER — Ambulatory Visit: Payer: Self-pay | Admitting: Physician Assistant

## 2015-06-25 ENCOUNTER — Encounter: Payer: Self-pay | Admitting: Physician Assistant

## 2015-06-25 VITALS — BP 130/70 | HR 79 | Temp 97.7°F

## 2015-06-25 DIAGNOSIS — J018 Other acute sinusitis: Secondary | ICD-10-CM

## 2015-06-25 DIAGNOSIS — H5789 Other specified disorders of eye and adnexa: Secondary | ICD-10-CM

## 2015-06-25 DIAGNOSIS — H1030 Unspecified acute conjunctivitis, unspecified eye: Secondary | ICD-10-CM

## 2015-06-25 MED ORDER — TOBRAMYCIN 0.3 % OP SOLN: 2.0000 [drp] | OPHTHALMIC | Status: DC

## 2015-06-25 MED ORDER — AMOXICILLIN 875 MG PO TABS: 875.0000 mg | ORAL_TABLET | Freq: Two times a day (BID) | ORAL | Status: DC

## 2015-06-25 MED ORDER — ONDANSETRON 4 MG PO TBDP: 4.0000 mg | ORAL_TABLET | Freq: Three times a day (TID) | ORAL | Status: DC | PRN

## 2015-06-25 NOTE — Progress Notes (Signed)
S: C/o headache and pain around r maxilla, teeth hurt, eyes is irritated, having difficulty with light, +photophobia;  for 3 days, no fever, chills, cp/sob, v/d; did wear her contacts for a week,  Using otc meds:   O: PE: vitals wnl, nad, perrl eomi, r eye appears minimally swollen, some redness in conjunctiva, normocephalic, tms dull, nasal mucosa red and swollen, r max sinus tender to percussion, throat injected, neck supple no lymph, lungs c t a, cv rrr, neuro intact  A:  Acute sinusitis, conjunctivitis, eye pain with swelling   P: drink fluids, continue regular meds , use otc meds of choice, return if not improving in 5 days, return earlier if worsening , amoxil, tobramycin, refill on zofran per pt request; tsh level drawn today

## 2015-06-26 LAB — TSH: TSH: 1.69 u[IU]/mL (ref 0.450–4.500)

## 2015-09-30 IMAGING — US US PELVIS COMPLETE
1 series · 13 of 25 positions shown · non-contrast
Comparison: Pelvic ultrasound performed 07/27/2013

CLINICAL DATA: Acute onset of pelvic pain.  Initial encounter.

EXAM:
TRANSABDOMINAL AND TRANSVAGINAL ULTRASOUND OF PELVIS
TECHNIQUE: Both transabdominal and transvaginal ultrasound examinations of the
pelvis were performed. Transabdominal technique was performed for
global imaging of the pelvis including uterus, ovaries, adnexal
regions, and pelvic cul-de-sac. It was necessary to proceed with
endovaginal exam following the transabdominal exam to visualize the
endometrium and ovaries in greater detail.

[Series 1: us pelvis complete · 0.24mm/px · 13 of 91 slices shown]
[im 1/91]
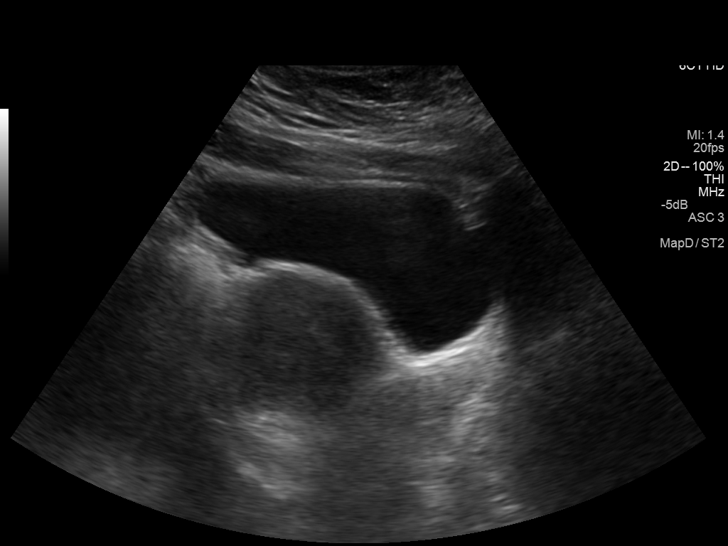
[im 8/91]
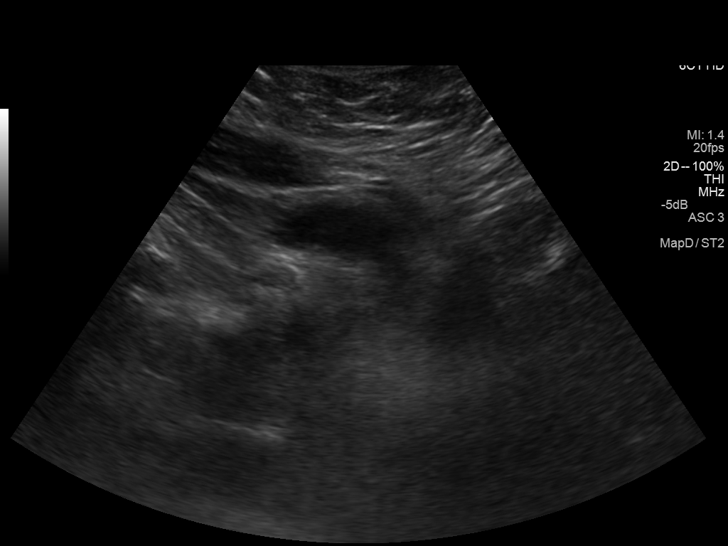
[im 16/91]
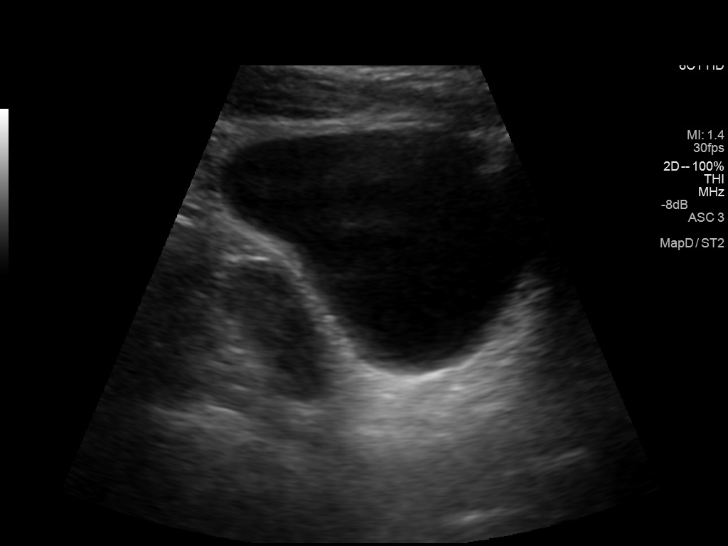
[im 23/91]
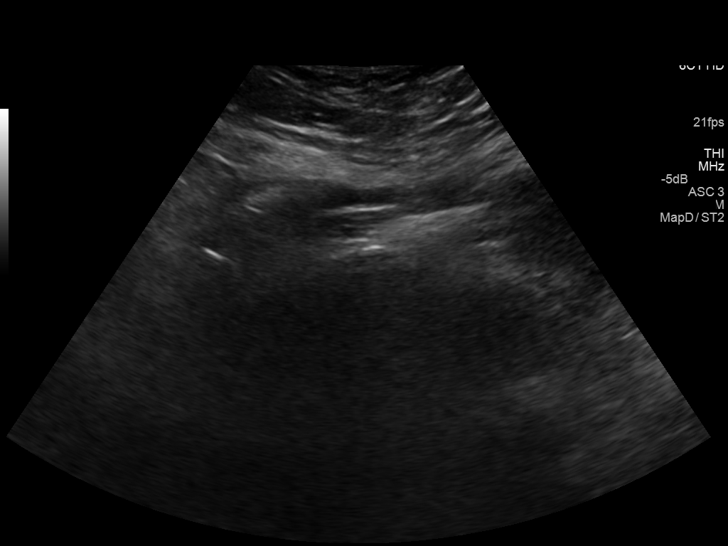
[im 31/91]
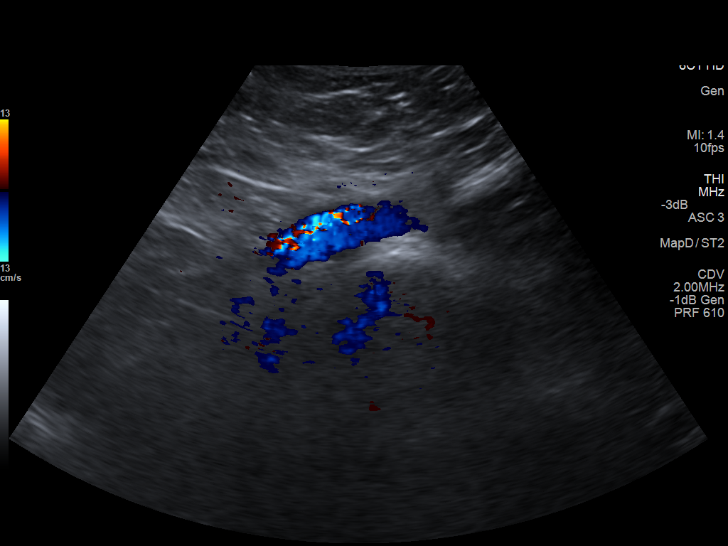
[im 38/91]
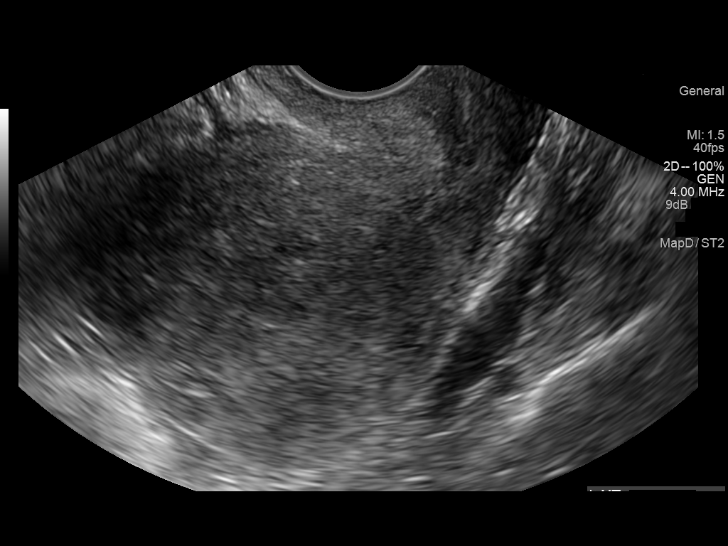
[im 46/91]
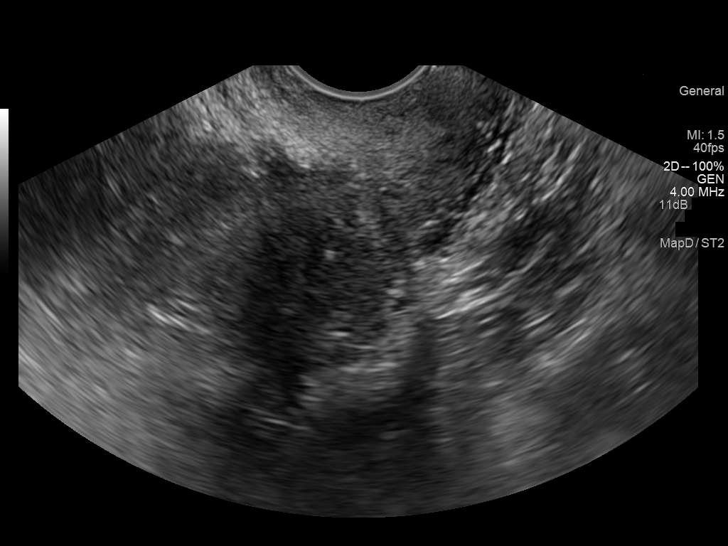
[im 53/91]
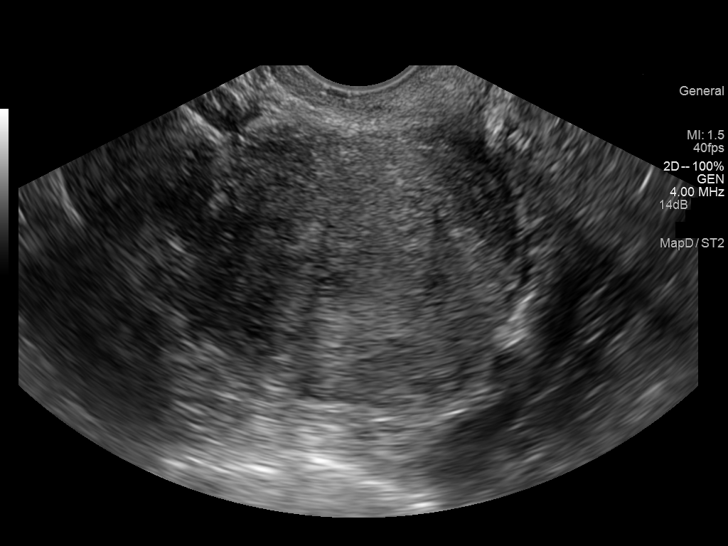
[im 61/91]
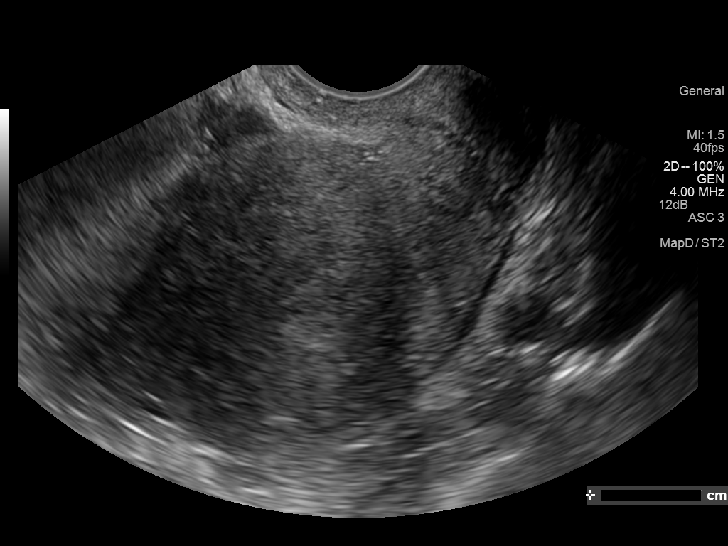
[im 68/91]
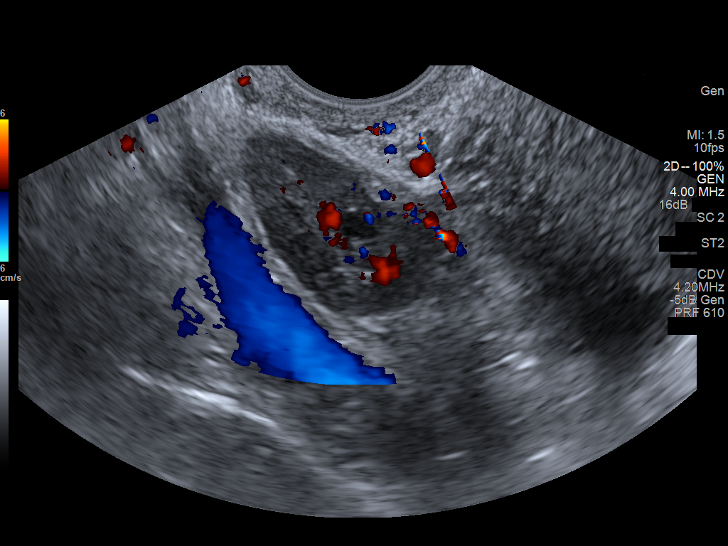
[im 76/91]
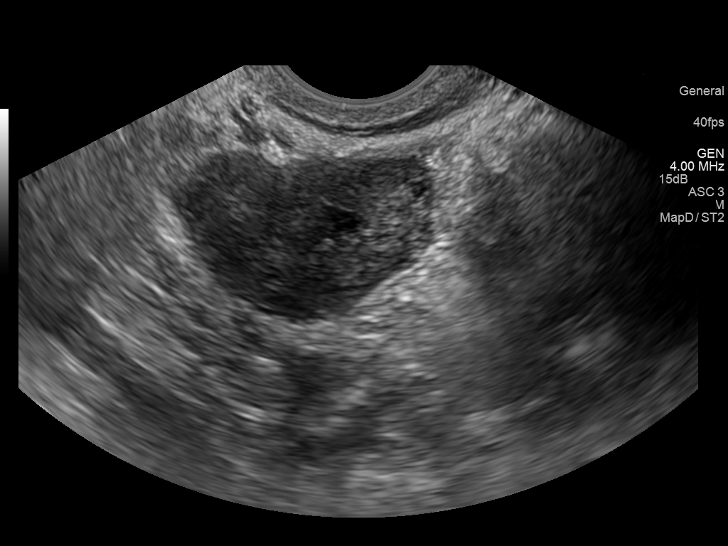
[im 83/91]
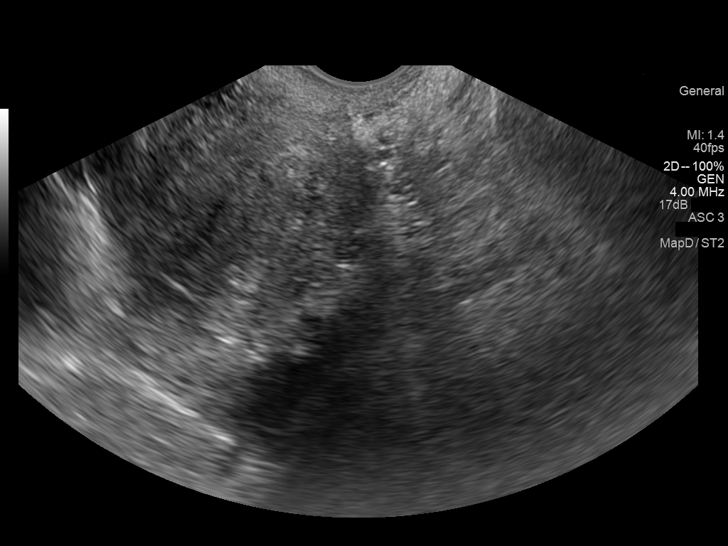
[im 91/91]
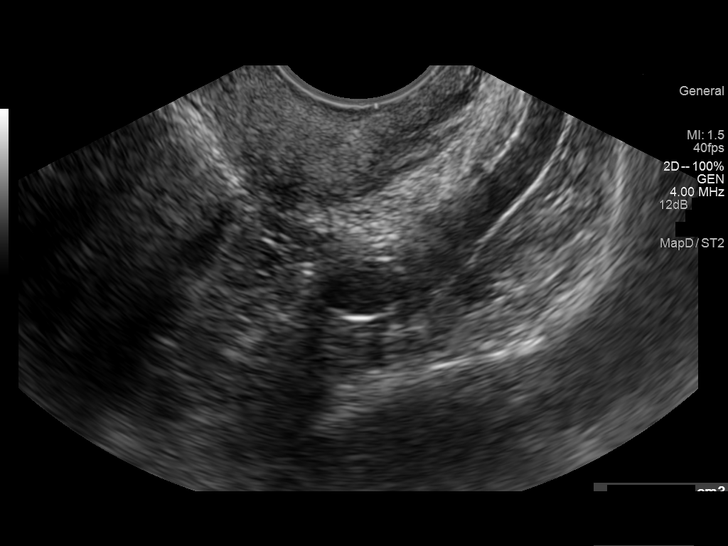

[13 of 25 positions shown; findings below may reference images not displayed]

FINDINGS: Uterus

Measurements: 8.7 x 4.4 x 6.2 cm. A few small nonspecific cystic
areas are seen within the myometrium. No mass seen.

Endometrium

Thickness: 0.6 cm.  Not well defined.

Right ovary

Measurements: 3.9 x 1.8 x 3.4 cm. Normal appearance/no adnexal mass.

Left ovary

Measurements: 2.8 x 1.8 x 1.9 cm. Normal appearance/no adnexal mass.

Other findings

A small amount of free fluid is seen within the pelvic cul-de-sac.
IMPRESSION: 1. No acute abnormality seen to explain the patient's symptoms. No
evidence for ovarian torsion.
2. Few small nonspecific cystic areas within the myometrium. No
evidence of mass. Endometrium not well defined.

## 2015-10-13 ENCOUNTER — Other Ambulatory Visit: Payer: Self-pay | Admitting: Obstetrics and Gynecology

## 2015-11-03 NOTE — Progress Notes (Signed)
ANNUAL PREVENTATIVE CARE GYN  ENCOUNTER NOTE  Subjective:       Michele Hansen is a 40 y.o. G0P0000 female here for a routine annual gynecologic exam.  Current complaints: 1.   Very tired; menses are 3 days and not very heavy; no intermenstrual bleeding   Gynecologic History No LMP recorded. Contraception: IUD Last Pap: 01/16/2014 neg/neg. Results were: normal Last mammogram: n/a. Results were: [name].  Obstetric History OB History  Gravida Para Term Preterm AB Living  0 0 0 0 0 0  SAB TAB Ectopic Multiple Live Births  0 0 0 0          Past Medical History:  Diagnosis Date  . Abnormal uterine bleeding (AUB)   . AR (allergic rhinitis)   . Constipation   . Endometriosis 2014  . Family history of breast cancer   . Fatigue   . Heavy periods   . Infertility, female   . Irregular periods   . Painful menstrual periods   . Polycystic ovarian disease 2010  . Yeast infection     Past Surgical History:  Procedure Laterality Date  . BREAST SURGERY Bilateral 2004   breast reduction  . cryotherapy    . CYSTECTOMY Right 2015   breast  . DILATION AND CURETTAGE OF UTERUS    . HYSTEROSCOPY W/D&C N/A 05/13/2014   Procedure: DILATATION AND CURETTAGE /HYSTEROSCOPY;  Surgeon: Herold HarmsMartin A Lamoine Magallon, MD;  Location: [facility] ORS;  Service: Gynecology;  Laterality: N/A;  . MASS EXCISION N/A 2010   from uterus    Current Outpatient Prescriptions on File Prior to Visit  Medication Sig Dispense Refill  . amoxicillin (AMOXIL) 875 MG tablet Take 1 tablet (875 mg total) by mouth 2 (two) times daily. 20 tablet 0  . Cholecalciferol (VITAMIN D3) 2000 UNITS CHEW Chew 2,000 mg by mouth every morning. Reported on 06/25/2015    . diclofenac sodium (VOLTAREN) 1 % GEL Apply 4 g topically 4 (four) times daily. (Patient not taking: Reported on 06/25/2015) 100 g 6  . ibuprofen (ADVIL,MOTRIN) 800 MG tablet Take 1 tablet (800 mg total) by mouth 3 (three) times daily. 90 tablet 3  . loratadine (CLARITIN) 10 MG tablet  Take 10 mg by mouth daily as needed for allergies.    . metFORMIN (GLUCOPHAGE) 500 MG tablet Take 500 mg by mouth 2 (two) times daily with a meal.    . metFORMIN (GLUCOPHAGE-XR) 500 MG 24 hr tablet TAKE ONE TABLET BY MOUTH TWICE DAILY 60 tablet 1  . naproxen (NAPROSYN) 500 MG tablet Reported on 06/25/2015  0  . ondansetron (ZOFRAN ODT) 4 MG disintegrating tablet Take 1 tablet (4 mg total) by mouth every 8 (eight) hours as needed for nausea or vomiting. 15 tablet 3  . Prenatal Vit-Fe Fumarate-FA (MULTIVITAMIN-PRENATAL) 27-0.8 MG TABS tablet Take 2 tablets by mouth daily at 12 noon.    [name]. scopolamine (TRANSDERM-SCOP, 1.5 MG,) 1 MG/3DAYS Place 1 patch (1.5 mg total) onto the skin every 3 (three) days. 10 patch 1  . tobramycin (TOBREX) 0.3 % ophthalmic solution Place 2 drops into the right eye every 4 (four) hours. 5 mL 0  . triamcinolone (NASACORT) 55 MCG/ACT AERO nasal inhaler Place 2 sprays into the nose daily.    . vitamin C (ASCORBIC ACID) 500 MG tablet Take 500 mg by mouth every morning. Reported on 06/25/2015    . Vitamin D, Ergocalciferol, (DRISDOL) 50000 units CAPS capsule Take 1 capsule (50,000 Units total) by mouth every 7 (seven) days. 4  capsule 12   No current facility-administered medications on file prior to visit.     Allergies  Allergen Reactions  . Banana Anaphylaxis and Nausea And Vomiting  . Latex Itching and Rash  . Tape     Social History   Social History  . Marital status: Single    Spouse name: N/A  . Number of children: N/A  . Years of education: N/A   Occupational History  . Not on file.   Social History Main Topics  . Smoking status: Never Smoker  . Smokeless tobacco: Not on file  . Alcohol use No  . Drug use: No  . Sexual activity: Yes     Comment: no birth control, desires pregnancy   Other Topics Concern  . Not on file   Social History Narrative  . No narrative on file    Family History  Problem Relation Age of Onset  . Diabetes Father   .  Colon cancer Father   . Depression Maternal Grandfather   . Breast cancer Paternal Grandmother   . Heart disease Neg Hx   . Ovarian cancer Neg Hx     The following portions of the patient's history were reviewed and updated as appropriate: allergies, current medications, past family history, past medical history, past social history, past surgical history and problem list.  Review of Systems ROS Review of Systems - General ROS: negative for - chills, fatigue, fever, hot flashes, night sweats, weight gain or weight loss Psychological ROS: negative for - anxiety, decreased libido, depression, mood swings, physical abuse or sexual abuse Ophthalmic ROS: negative for - blurry vision, eye pain or loss of vision ENT ROS: negative for - headaches, hearing change, visual changes or vocal changes Allergy and Immunology ROS: negative for - hives, itchy/watery eyes or seasonal allergies Hematological and Lymphatic ROS: negative for - bleeding problems, bruising, swollen lymph nodes or weight loss Endocrine ROS: negative for - galactorrhea, hair pattern changes, hot flashes, malaise/lethargy, mood swings, palpitations, polydipsia/polyuria, skin changes, temperature intolerance or unexpected weight changes Breast ROS: negative for - new or changing breast lumps or nipple discharge Respiratory ROS: negative for - cough or shortness of breath Cardiovascular ROS: negative for - chest pain, irregular heartbeat, palpitations or shortness of breath Gastrointestinal ROS: no abdominal pain, change in bowel habits, or black or bloody stools Genito-Urinary ROS: no dysuria, trouble voiding, or hematuria Musculoskeletal ROS: negative for - joint pain or joint stiffness Neurological ROS: negative for - bowel and bladder control changes Dermatological ROS: negative for rash and skin lesion changes   Objective:   BP 130/78   Pulse 84   Ht 5\' 8"  (1.727 m)   Wt 288 lb 1.6 oz (130.7 kg)   LMP 09/14/2015 (Exact  Date)   BMI 43.81 kg/m  CONSTITUTIONAL: Well-developed, well-nourished female in no acute distress.  PSYCHIATRIC: Normal mood and affect. Normal behavior. Normal judgment and thought content. NEUROLGIC: Alert and oriented to person, place, and time. Normal muscle tone coordination. No cranial nerve deficit noted. HENT:  Normocephalic, atraumatic, External right and left ear normal. Oropharynx is clear and moist EYES: Conjunctivae and EOM are normal. Pupils are equal, round, and reactive to light. No scleral icterus.  NECK: Normal range of motion, supple, no masses.  Normal thyroid.  SKIN: Skin is warm and dry. No rash noted. Not diaphoretic. No erythema. No pallor. CARDIOVASCULAR: Normal heart rate noted, regular rhythm, no murmur. RESPIRATORY: Clear to auscultation bilaterally. Effort and breath sounds normal, no problems with respiration  noted. BREASTS: Symmetric in size. No masses, skin changes, nipple drainage, or lymphadenopathy. ABDOMEN: Soft, normal bowel sounds, no distention noted.  No tenderness, rebound or guarding.  BLADDER: Normal PELVIC:  External Genitalia: Normal  BUS: Normal  Vagina: Normal  Cervix: Normal; IUD strings 3 cm  Uterus: Normal; top normal size, mobile, nontender  Adnexa: Normal; nonpalpable nontender  RV: External Exam NormaI  MUSCULOSKELETAL: Normal range of motion. No tenderness.  No cyanosis, clubbing, or edema.  2+ distal pulses. LYMPHATIC: No Axillary, Supraclavicular, or Inguinal Adenopathy.    Assessment:   Annual gynecologic examination 40 y.o. Contraception: IUD Obesity 2 Problem List Items Addressed This Visit    None    Visit Diagnoses    Well woman exam with routine gynecological exam    -  Primary    Adenomyosis, minimally symptomatic with Mirena IUD Chronic fatigue, unclear etiology  Plan:  Pap: due 2019 Mammogram: due age 47 Stool Guaiac Testing:  Not Indicated Labs: CBC, Lipid 1, FBS, TSH, Hemoglobin A1C and Vit D  Level"". Routine preventative health maintenance measures emphasized: Exercise/Diet/Weight control, Tobacco Warnings and Alcohol/Substance use risks Return to Clinic - 1 Year   Crystal [place], CMA  Herold Harms, MD  Note: This dictation was prepared with Dragon dictation along with smaller phrase technology. Any transcriptional errors that result from this process are unintentional.

## 2015-11-04 ENCOUNTER — Encounter: Payer: Managed Care, Other (non HMO) | Admitting: Obstetrics and Gynecology

## 2015-11-05 ENCOUNTER — Ambulatory Visit (INDEPENDENT_AMBULATORY_CARE_PROVIDER_SITE_OTHER): Payer: Managed Care, Other (non HMO) | Admitting: Obstetrics and Gynecology

## 2015-11-05 ENCOUNTER — Encounter: Payer: Self-pay | Admitting: Obstetrics and Gynecology

## 2015-11-05 VITALS — BP 130/78 | HR 84 | Ht 68.0 in | Wt 288.1 lb

## 2015-11-05 DIAGNOSIS — Z01419 Encounter for gynecological examination (general) (routine) without abnormal findings: Secondary | ICD-10-CM | POA: Diagnosis not present

## 2015-11-05 DIAGNOSIS — R5383 Other fatigue: Secondary | ICD-10-CM

## 2015-11-05 NOTE — Patient Instructions (Signed)
1. No Pap smear necessary 2. Self breast awareness encouraged 3. Continue with healthy eating and exercise with control weight loss 4. Screening labs are obtained including CBC 5. Continue with prenatal vitamins daily, vitamin D supplementation 1000 units daily 6. Return in 1 year   Health Maintenance, Female Adopting a healthy lifestyle and getting preventive care can go a long way to promote health and wellness. Talk with your health care provider about what schedule of regular examinations is right for you. This is a good chance for you to check in with your provider about disease prevention and staying healthy. In between checkups, there are plenty of things you can do on your own. Experts have done a lot of research about which lifestyle changes and preventive measures are most likely to keep you healthy. Ask your health care provider for more information. WEIGHT AND DIET  Eat a healthy diet  Be sure to include plenty of vegetables, fruits, low-fat dairy products, and lean protein.  Do not eat a lot of foods high in solid fats, added sugars, or salt.  Get regular exercise. This is one of the most important things you can do for your health.  Most adults should exercise for at least 150 minutes each week. The exercise should increase your heart rate and make you sweat (moderate-intensity exercise).  Most adults should also do strengthening exercises at least twice a week. This is in addition to the moderate-intensity exercise.  Maintain a healthy weight  Body mass index (BMI) is a measurement that can be used to identify possible weight problems. It estimates body fat based on height and weight. Your health care provider can help determine your BMI and help you achieve or maintain a healthy weight.  For females 43 years of age and older:   A BMI below 18.5 is considered underweight.  A BMI of 18.5 to 24.9 is normal.  A BMI of 25 to 29.9 is considered overweight.  A BMI of 30  and above is considered obese.  Watch levels of cholesterol and blood lipids  You should start having your blood tested for lipids and cholesterol at 38 years of age, then have this test every 5 years.  You may need to have your cholesterol levels checked more often if:  Your lipid or cholesterol levels are high.  You are older than 38 years of age.  You are at high risk for heart disease.  CANCER SCREENING   Lung Cancer  Lung cancer screening is recommended for adults 33-63 years old who are at high risk for lung cancer because of a history of smoking.  A yearly low-dose CT scan of the lungs is recommended for people who:  Currently smoke.  Have quit within the past 15 years.  Have at least a 30-pack-year history of smoking. A pack year is smoking an average of one pack of cigarettes a day for 1 year.  Yearly screening should continue until it has been 15 years since you quit.  Yearly screening should stop if you develop a health problem that would prevent you from having lung cancer treatment.  Breast Cancer  Practice breast self-awareness. This means understanding how your breasts normally appear and feel.  It also means doing regular breast self-exams. Let your health care provider know about any changes, no matter how small.  If you are in your 20s or 30s, you should have a clinical breast exam (CBE) by a health care provider every 1-3 years as part of  a regular health exam.  If you are 40 or older, have a CBE every year. Also consider having a breast X-ray (mammogram) every year.  If you have a family history of breast cancer, talk to your health care provider about genetic screening.  If you are at high risk for breast cancer, talk to your health care provider about having an MRI and a mammogram every year.  Breast cancer gene (BRCA) assessment is recommended for women who have family members with BRCA-related cancers. BRCA-related cancers  include:  Breast.  Ovarian.  Tubal.  Peritoneal cancers.  Results of the assessment will determine the need for genetic counseling and BRCA1 and BRCA2 testing. Cervical Cancer Your health care provider may recommend that you be screened regularly for cancer of the pelvic organs (ovaries, uterus, and vagina). This screening involves a pelvic examination, including checking for microscopic changes to the surface of your cervix (Pap test). You may be encouraged to have this screening done every 3 years, beginning at age 21.  For women ages 30-65, health care providers may recommend pelvic exams and Pap testing every 3 years, or they may recommend the Pap and pelvic exam, combined with testing for human papilloma virus (HPV), every 5 years. Some types of HPV increase your risk of cervical cancer. Testing for HPV may also be done on women of any age with unclear Pap test results.  Other health care providers may not recommend any screening for nonpregnant women who are considered low risk for pelvic cancer and who do not have symptoms. Ask your health care provider if a screening pelvic exam is right for you.  If you have had past treatment for cervical cancer or a condition that could lead to cancer, you need Pap tests and screening for cancer for at least 20 years after your treatment. If Pap tests have been discontinued, your risk factors (such as having a new sexual partner) need to be reassessed to determine if screening should resume. Some women have medical problems that increase the chance of getting cervical cancer. In these cases, your health care provider may recommend more frequent screening and Pap tests. Colorectal Cancer  This type of cancer can be detected and often prevented.  Routine colorectal cancer screening usually begins at 38 years of age and continues through 38 years of age.  Your health care provider may recommend screening at an earlier age if you have risk factors for  colon cancer.  Your health care provider may also recommend using home test kits to check for hidden blood in the stool.  A small camera at the end of a tube can be used to examine your colon directly (sigmoidoscopy or colonoscopy). This is done to check for the earliest forms of colorectal cancer.  Routine screening usually begins at age 50.  Direct examination of the colon should be repeated every 5-10 years through 38 years of age. However, you may need to be screened more often if early forms of precancerous polyps or small growths are found. Skin Cancer  Check your skin from head to toe regularly.  Tell your health care provider about any new moles or changes in moles, especially if there is a change in a mole's shape or color.  Also tell your health care provider if you have a mole that is larger than the size of a pencil eraser.  Always use sunscreen. Apply sunscreen liberally and repeatedly throughout the day.  Protect yourself by wearing long sleeves, pants, a wide-brimmed   hat, and sunglasses whenever you are outside. HEART DISEASE, DIABETES, AND HIGH BLOOD PRESSURE   High blood pressure causes heart disease and increases the risk of stroke. High blood pressure is more likely to develop in:  People who have blood pressure in the high end of the normal range (130-139/85-89 mm Hg).  People who are overweight or obese.  People who are African American.  If you are 30-44 years of age, have your blood pressure checked every 3-5 years. If you are 37 years of age or older, have your blood pressure checked every year. You should have your blood pressure measured twice--once when you are at a hospital or clinic, and once when you are not at a hospital or clinic. Record the average of the two measurements. To check your blood pressure when you are not at a hospital or clinic, you can use:  An automated blood pressure machine at a pharmacy.  A home blood pressure monitor.  If you  are between 72 years and 87 years old, ask your health care provider if you should take aspirin to prevent strokes.  Have regular diabetes screenings. This involves taking a blood sample to check your fasting blood sugar level.  If you are at a normal weight and have a low risk for diabetes, have this test once every three years after 38 years of age.  If you are overweight and have a high risk for diabetes, consider being tested at a younger age or more often. PREVENTING INFECTION  Hepatitis B  If you have a higher risk for hepatitis B, you should be screened for this virus. You are considered at high risk for hepatitis B if:  You were born in a country where hepatitis B is common. Ask your health care provider which countries are considered high risk.  Your parents were born in a high-risk country, and you have not been immunized against hepatitis B (hepatitis B vaccine).  You have HIV or AIDS.  You use needles to inject street drugs.  You live with someone who has hepatitis B.  You have had sex with someone who has hepatitis B.  You get hemodialysis treatment.  You take certain medicines for conditions, including cancer, organ transplantation, and autoimmune conditions. Hepatitis C  Blood testing is recommended for:  Everyone born from [birth year] through 1963.  Anyone with known risk factors for hepatitis C. Sexually transmitted infections (STIs)  You should be screened for sexually transmitted infections (STIs) including gonorrhea and chlamydia if:  You are sexually active and are younger than 38 years of age.  You are older than 38 years of age and your health care provider tells you that you are at risk for this type of infection.  Your sexual activity has changed since you were last screened and you are at an increased risk for chlamydia or gonorrhea. Ask your health care provider if you are at risk.  If you do not have HIV, but are at risk, it may be recommended that you  take a prescription medicine daily to prevent HIV infection. This is called pre-exposure prophylaxis (PrEP). You are considered at risk if:  You are sexually active and do not regularly use condoms or know the HIV status of your partner(s).  You take drugs by injection.  You are sexually active with a partner who has HIV. Talk with your health care provider about whether you are at high risk of being infected with HIV. If you choose to begin PrEP, you  should first be tested for HIV. You should then be tested every 3 months for as long as you are taking PrEP.  PREGNANCY   If you are premenopausal and you may become pregnant, ask your health care provider about preconception counseling.  If you may become pregnant, take 400 to 800 micrograms (mcg) of folic acid every day.  If you want to prevent pregnancy, talk to your health care provider about birth control (contraception). OSTEOPOROSIS AND MENOPAUSE   Osteoporosis is a disease in which the bones lose minerals and strength with aging. This can result in serious bone fractures. Your risk for osteoporosis can be identified using a bone density scan.  If you are 65 years of age or older, or if you are at risk for osteoporosis and fractures, ask your health care provider if you should be screened.  Ask your health care provider whether you should take a calcium or vitamin D supplement to lower your risk for osteoporosis.  Menopause may have certain physical symptoms and risks.  Hormone replacement therapy may reduce some of these symptoms and risks. Talk to your health care provider about whether hormone replacement therapy is right for you.  HOME CARE INSTRUCTIONS   Schedule regular health, dental, and eye exams.  Stay current with your immunizations.   Do not use any tobacco products including cigarettes, chewing tobacco, or electronic cigarettes.  If you are pregnant, do not drink alcohol.  If you are breastfeeding, limit how  much and how often you drink alcohol.  Limit alcohol intake to no more than 1 drink per day for nonpregnant women. One drink equals 12 ounces of beer, 5 ounces of wine, or 1 ounces of hard liquor.  Do not use street drugs.  Do not share needles.  Ask your health care provider for help if you need support or information about quitting drugs.  Tell your health care provider if you often feel depressed.  Tell your health care provider if you have ever been abused or do not feel safe at home.   This information is not intended to replace advice given to you by your health care provider. Make sure you discuss any questions you have with your health care provider.   Document Released: 07/06/2010 Document Revised: 01/11/2014 Document Reviewed: 11/22/2012 Elsevier Interactive Patient Education 2016 [employer]  

## 2015-11-07 ENCOUNTER — Ambulatory Visit
Admission: RE | Admit: 2015-11-07 | Discharge: 2015-11-07 | Disposition: A | Discharge status: Home / Self Care | Payer: Managed Care, Other (non HMO) | Source: Ambulatory Visit | Attending: Physician Assistant | Admitting: Physician Assistant

## 2015-11-07 ENCOUNTER — Other Ambulatory Visit: Payer: Self-pay

## 2015-11-07 DIAGNOSIS — M79604 Pain in right leg: Secondary | ICD-10-CM | POA: Diagnosis not present

## 2015-11-07 DIAGNOSIS — Z299 Encounter for prophylactic measures, unspecified: Secondary | ICD-10-CM

## 2015-11-07 NOTE — Addendum Note (Signed)
Addended by: Catha BrowEACON, MONIQUE T on: 11/07/2015 01:50 PM   Modules accepted: Orders

## 2015-11-07 NOTE — Progress Notes (Addendum)
S: states having posterior knee pain and swelling, has bad varicose veins in this area, worried its a blood clot No known injury, did have knee pain on left side but has seen [facility] for this  O: pulse wnl, r knee with posterior swelling similar to bakers cyst, no warm or redness, calf is a little tender, n/v intact  A; leg pain  P: us r lower leg stat  US called and scan is neg for dvt, preliminary report, still waiting on stat report by radiologist

## 2015-11-08 LAB — CMP12+LP+TP+TSH+6AC+CBC/D/PLT
ALBUMIN: 4.4 g/dL (ref 3.5–5.5)
ALT: 15 IU/L (ref 0–32)
AST: 16 IU/L (ref 0–40)
Albumin/Globulin Ratio: 1.3 (ref 1.2–2.2)
Alkaline Phosphatase: 65 IU/L (ref 39–117)
BASOS ABS: 0 10*3/uL (ref 0.0–0.2)
BUN / CREAT RATIO: 13 (ref 9–23)
BUN: 11 mg/dL (ref 6–20)
Basos: 0 %
Bilirubin Total: 0.6 mg/dL (ref 0.0–1.2)
CALCIUM: 9.5 mg/dL (ref 8.7–10.2)
CHLORIDE: 100 mmol/L (ref 96–106)
CREATININE: 0.84 mg/dL (ref 0.57–1.00)
Chol/HDL Ratio: 3.6 ratio units (ref 0.0–4.4)
Cholesterol, Total: 176 mg/dL (ref 100–199)
EOS (ABSOLUTE): 0.2 10*3/uL (ref 0.0–0.4)
EOS: 3 %
ESTIMATED CHD RISK: 0.6 times avg. (ref 0.0–1.0)
Free Thyroxine Index: 1.9 (ref 1.2–4.9)
GFR calc Af Amer: 102 mL/min/{1.73_m2} (ref >59)
GFR, EST NON AFRICAN AMERICAN: 88 mL/min/{1.73_m2} (ref >59)
GGT: 21 IU/L (ref 0–60)
GLUCOSE: 84 mg/dL (ref 65–99)
Globulin, Total: 3.3 g/dL (ref 1.5–4.5)
HDL: 49 mg/dL (ref >39)
HEMOGLOBIN: 13.4 g/dL (ref 11.1–15.9)
Hematocrit: 41.3 % (ref 34.0–46.6)
IRON: 100 ug/dL (ref 27–159)
Immature Grans (Abs): 0 10*3/uL (ref 0.0–0.1)
Immature Granulocytes: 0 %
LDH: 180 IU/L (ref 119–226)
LDL Calculated: 111 mg/dL — ABNORMAL HIGH (ref 0–99)
LYMPHS ABS: 2 10*3/uL (ref 0.7–3.1)
Lymphs: 36 %
MCH: 28.9 pg (ref 26.6–33.0)
MCHC: 32.4 g/dL (ref 31.5–35.7)
MCV: 89 fL (ref 79–97)
Monocytes Absolute: 0.4 10*3/uL (ref 0.1–0.9)
Monocytes: 8 %
Neutrophils Absolute: 2.9 10*3/uL (ref 1.4–7.0)
Neutrophils: 53 %
PHOSPHORUS: 3 mg/dL (ref 2.5–4.5)
PLATELETS: 282 10*3/uL (ref 150–379)
Potassium: 4.8 mmol/L (ref 3.5–5.2)
RBC: 4.63 x10E6/uL (ref 3.77–5.28)
RDW: 14.3 % (ref 12.3–15.4)
SODIUM: 138 mmol/L (ref 134–144)
T3 UPTAKE RATIO: 25 % (ref 24–39)
T4 TOTAL: 7.4 ug/dL (ref 4.5–12.0)
TOTAL PROTEIN: 7.7 g/dL (ref 6.0–8.5)
TSH: 2.38 u[IU]/mL (ref 0.450–4.500)
Triglycerides: 81 mg/dL (ref 0–149)
URIC ACID: 4.5 mg/dL (ref 2.5–7.1)
VLDL CHOLESTEROL CAL: 16 mg/dL (ref 5–40)
WBC: 5.5 10*3/uL (ref 3.4–10.8)

## 2015-11-08 LAB — VITAMIN D 25 HYDROXY (VIT D DEFICIENCY, FRACTURES): Vit D, 25-Hydroxy: 28.8 ng/mL — ABNORMAL LOW (ref 30.0–100.0)

## 2015-11-08 LAB — HGB A1C W/O EAG: Hgb A1c MFr Bld: 4.9 % (ref 4.8–5.6)

## 2015-11-20 ENCOUNTER — Encounter: Payer: Self-pay | Admitting: Physician Assistant

## 2015-11-20 ENCOUNTER — Ambulatory Visit: Payer: Self-pay | Admitting: Physician Assistant

## 2015-11-20 VITALS — BP 124/80 | HR 74 | Temp 98.0°F

## 2015-11-20 DIAGNOSIS — J0100 Acute maxillary sinusitis, unspecified: Secondary | ICD-10-CM

## 2015-11-20 MED ORDER — FLUTICASONE PROPIONATE 50 MCG/ACT NA SUSP: 2.0000 | Freq: Every day | NASAL | 12 refills | Status: DC

## 2015-11-20 MED ORDER — AZITHROMYCIN 250 MG PO TABS: ORAL_TABLET | ORAL | 0 refills | Status: DC

## 2015-11-20 MED ORDER — PREDNISONE 10 MG PO TABS: 30.0000 mg | ORAL_TABLET | Freq: Every day | ORAL | 0 refills | Status: DC

## 2015-11-20 NOTE — Progress Notes (Signed)
S: C/o runny nose and congestion for 7 days, no fever, chills, cp/sob, v/d; mucus is green and thick, cough is sporadic, c/o of facial and dental pain.   Using otc meds: flonase, saline nasal spray  O: PE: vitals wnl, nad, perrl eomi, normocephalic, tms dull, nasal mucosa red and swollen on r side, swollen on left, throat injected, neck supple no lymph, lungs c t a, cv rrr, neuro intact  A:  Acute sinusitis   P: drink fluids, continue regular meds , use otc meds of choice, return if not improving in 5 days, return earlier if worsening, zpack, flonase, pred 30mg  qd x 3d

## 2016-01-16 ENCOUNTER — Ambulatory Visit: Payer: Self-pay | Admitting: Physician Assistant

## 2016-01-16 ENCOUNTER — Encounter: Payer: Self-pay | Admitting: Physician Assistant

## 2016-01-16 VITALS — BP 130/80 | HR 79 | Temp 98.6°F

## 2016-01-16 DIAGNOSIS — R21 Rash and other nonspecific skin eruption: Secondary | ICD-10-CM

## 2016-01-16 NOTE — Progress Notes (Signed)
S: c/o rash on buttock, noticed it yesterday, burned when she put hydrocortisone cream on it, no other pain or burning, no itching, just has raised bumps in the area, no fever/chills  O: vitals wnl, nad, skin with small white bumps on buttock, no vesicles, no pustules, area is not indurated, slight redness around bumps but doesn't appear to be shingles, n/ v intact  A: rash  P: try otc neosporin, warm water soaks, if worsening or painful return for recheck

## 2016-04-12 ENCOUNTER — Ambulatory Visit: Payer: Self-pay | Admitting: Physician Assistant

## 2016-04-12 VITALS — BP 139/80 | HR 85 | Temp 98.3°F

## 2016-04-12 DIAGNOSIS — J069 Acute upper respiratory infection, unspecified: Secondary | ICD-10-CM

## 2016-04-12 MED ORDER — MONTELUKAST SODIUM 10 MG PO TABS: 10.0000 mg | ORAL_TABLET | Freq: Every day | ORAL | 3 refills | Status: DC

## 2016-04-12 MED ORDER — ALBUTEROL SULFATE HFA 108 (90 BASE) MCG/ACT IN AERS: 2.0000 | INHALATION_SPRAY | Freq: Four times a day (QID) | RESPIRATORY_TRACT | 0 refills | Status: DC | PRN

## 2016-04-12 MED ORDER — BENZONATATE 200 MG PO CAPS: 200.0000 mg | ORAL_CAPSULE | Freq: Three times a day (TID) | ORAL | 0 refills | Status: DC | PRN

## 2016-04-12 NOTE — Progress Notes (Signed)
S: C/o runny nose and congestion for 3 days, no fever, chills, cp/sob, v/d; mucus was green this am but clear throughout the day, cough is sporadic, cough kept her awake at night, coughed so hard she threw up this morning, some sore throat, used sudafed, and allergy meds without relief, ?if could get a refill on her inhaler  Using otc meds: sudafed, zyrtec  O: PE: vitals wnl, nad, perrl eomi, normocephalic, tms dull, nasal mucosa red and swollen, throat injected, neck supple no lymph, lungs c t a, cv rrr, neuro intact  A:  Acute viral uri   P: drink fluids, continue regular meds , use otc meds of choice, return if not improving in 5 days, return earlier if worsening , albuterol inhaler, singulair 10 mg qhs, tessalon perls

## 2016-05-10 ENCOUNTER — Encounter: Payer: Self-pay | Admitting: Physician Assistant

## 2016-05-10 ENCOUNTER — Ambulatory Visit: Payer: Self-pay | Admitting: Physician Assistant

## 2016-05-10 VITALS — BP 120/80 | HR 75 | Temp 97.9°F

## 2016-05-10 DIAGNOSIS — J0100 Acute maxillary sinusitis, unspecified: Secondary | ICD-10-CM

## 2016-05-10 MED ORDER — AZITHROMYCIN 250 MG PO TABS: ORAL_TABLET | ORAL | 0 refills | Status: DC

## 2016-05-10 NOTE — Progress Notes (Signed)
S: C/o runny nose and congestion for greater than 10d days, no fever, chills, cp/sob, v/d; mucus is green and thick, cough is sporadic, c/o of facial and dental pain. Using her allergy meds which have helped but now thinks she has an infection  Using otc meds:   O: PE: vitals wnl, nad, perrl eomi, normocephalic, tms dull, nasal mucosa red and swollen, throat injected, neck supple no lymph, lungs c t a, cv rrr, neuro intact  A:  Acute sinusitis   P: drink fluids, continue regular meds , use otc meds of choice, return if not improving in 5 days, return earlier if worsening, zpack

## 2016-07-15 ENCOUNTER — Ambulatory Visit: Payer: Self-pay | Admitting: Family

## 2016-07-15 VITALS — BP 130/80 | HR 77 | Temp 98.5°F | Resp 16 | Ht 68.0 in | Wt 300.0 lb

## 2016-07-15 DIAGNOSIS — Z0000 Encounter for general adult medical examination without abnormal findings: Secondary | ICD-10-CM

## 2016-07-15 NOTE — Progress Notes (Signed)
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 No complaints. She has just had surgery to her right knee for torn medial and lateral meniscus and is going to PT doing well. VSS H EENT wearing glasses, sub-conjunctival hemorrhages noted bilaterally otherwise unremarkable, neck-supple without megaly, heart RSR without abnormal sounds lungs clear abdomen nontender extremities full range of motion without edema assessment /wellness exam and professional school entrance exam. Plan see forms appended.

## 2016-09-22 ENCOUNTER — Ambulatory Visit: Payer: Self-pay | Admitting: Physician Assistant

## 2016-09-22 ENCOUNTER — Encounter: Payer: Self-pay | Admitting: Physician Assistant

## 2016-09-22 VITALS — BP 140/80 | HR 87 | Temp 98.4°F | Resp 16

## 2016-09-22 DIAGNOSIS — G43001 Migraine without aura, not intractable, with status migrainosus: Secondary | ICD-10-CM

## 2016-09-22 MED ORDER — KETOROLAC TROMETHAMINE 60 MG/2ML IM SOLN
60.0000 mg | Freq: Once | INTRAMUSCULAR | Status: AC
  Administered 2016-09-22: 60 mg via INTRAMUSCULAR

## 2016-09-22 MED ORDER — SUMATRIPTAN SUCCINATE 100 MG PO TABS: ORAL_TABLET | ORAL | 2 refills | Status: DC

## 2016-09-22 NOTE — Progress Notes (Signed)
S: c/o headache at front and back of head, sensitive to light and noise, some nausea the other day but none today, took a tramadol last night which did help a little, has a lot of stress in her life right now, going to NP school and mother was just dx with stage 4 breast CA, no v/d, hx of headaches in the past and this feels similar  O: vitals wnl, nad, perrl eomi, 2 beats nystagmus on horizontal plane, neck supple no lymph, lungs c t a, cv rrr, some spasms in shoulders b/l  A: migraine  P: toradol  IM, imitrex , drink plenty of fluids

## 2016-09-23 ENCOUNTER — Other Ambulatory Visit: Payer: Self-pay | Admitting: Obstetrics and Gynecology

## 2016-10-29 ENCOUNTER — Other Ambulatory Visit: Payer: Self-pay | Admitting: Obstetrics and Gynecology

## 2016-11-02 ENCOUNTER — Other Ambulatory Visit: Payer: Self-pay | Admitting: Obstetrics and Gynecology

## 2016-11-02 DIAGNOSIS — N6320 Unspecified lump in the left breast, unspecified quadrant: Secondary | ICD-10-CM

## 2016-11-08 NOTE — Progress Notes (Deleted)
ANNUAL PREVENTATIVE CARE GYN  ENCOUNTER NOTE  Subjective:       NYKIAH MA is a 41 y.o. G0P0000 female here for a routine annual gynecologic exam.  Current complaints: 1.   Very tired; menses are 3 days and not very heavy; no intermenstrual bleeding   Gynecologic History No LMP recorded. Contraception: IUD Last Pap: 01/16/2014 neg/neg. Results were: normal Last mammogram: n/a. Results were: [name]  Obstetric History OB History  Gravida Para Term Preterm AB Living  0 0 0 0 0 0  SAB TAB Ectopic Multiple Live Births  0 0 0 0          Past Medical History:  Diagnosis Date  . Abnormal uterine bleeding (AUB)   . AR (allergic rhinitis)   . Constipation   . Endometriosis 2014  . Family history of breast cancer   . Fatigue   . Heavy periods   . Infertility, female   . Irregular periods   . Knee MCL sprain   . Painful menstrual periods   . Polycystic ovarian disease 2010  . Yeast infection     Past Surgical History:  Procedure Laterality Date  . BREAST SURGERY Bilateral 2004   breast reduction  . cryotherapy    . CYSTECTOMY Right 2015   breast  . DILATION AND CURETTAGE OF UTERUS    . MASS EXCISION N/A 2010   from uterus    Current Outpatient Medications on File Prior to Visit  Medication Sig Dispense Refill  . albuterol (PROVENTIL HFA;VENTOLIN HFA) 108 (90 Base) MCG/ACT inhaler Inhale 2 puffs into the lungs every 6 (six) hours as needed for wheezing or shortness of breath. 1 Inhaler 0  . azithromycin (ZITHROMAX Z-PAK) 250 MG tablet 2 pills today then 1 pill a day for 4 days 6 each 0  . benzonatate (TESSALON) 200 MG capsule Take 1 capsule (200 mg total) by mouth 3 (three) times daily as needed for cough. (Patient not taking: Reported on 05/10/2016) 30 capsule 0  . fluticasone (FLONASE) 50 MCG/ACT nasal spray Place 2 sprays into both nostrils daily. 16 g 12  . loratadine (CLARITIN) 10 MG tablet Take 10 mg by mouth daily as needed for allergies.    . metFORMIN  (GLUCOPHAGE-XR) 500 MG 24 hr tablet TAKE ONE TABLET BY MOUTH TWICE DAILY 60 tablet 1  . montelukast (SINGULAIR) 10 MG tablet Take 1 tablet (10 mg total) by mouth at bedtime. 30 tablet 3  . naproxen (NAPROSYN) 500 MG tablet Reported on 06/25/2015  0  . Prenatal Vit-Fe Fumarate-FA (MULTIVITAMIN-PRENATAL) 27-0.8 MG TABS tablet Take 2 tablets by mouth daily at 12 noon.    . SUMAtriptan (IMITREX) 100 MG tablet Take one at onset of headache, May repeat in 2 hours if headache persists or recurs. 10 tablet 2  . vitamin C (ASCORBIC ACID) 500 MG tablet Take 500 mg by mouth every morning. Reported on 06/25/2015     No current facility-administered medications on file prior to visit.     Allergies  Allergen Reactions  . Banana Anaphylaxis and Nausea And Vomiting  . Latex Itching and Rash  . Tape     Social History   Socioeconomic History  . Marital status: Single    Spouse name: Not on file  . Number of children: Not on file  . Years of education: Not on file  . Highest education level: Not on file  Social Needs  . Financial resource strain: Not on file  . Food insecurity - worry:  Not on file  . Food insecurity - inability: Not on file  . Transportation needs - medical: Not on file  . Transportation needs - non-medical: Not on file  Occupational History  . Not on file  Tobacco Use  . Smoking status: Never Smoker  . Smokeless tobacco: Never Used  Substance and Sexual Activity  . Alcohol use: Yes    Comment: occas  . Drug use: No  . Sexual activity: Yes    Birth control/protection: IUD  Other Topics Concern  . Not on file  Social History Narrative  . Not on file    Family History  Problem Relation Age of Onset  . Diabetes Father   . Colon cancer Father   . Depression Maternal Grandfather   . Breast cancer Paternal Grandmother   . Heart disease Neg Hx   . Ovarian cancer Neg Hx     The following portions of the patient's history were reviewed and updated as appropriate:  allergies, current medications, past family history, past medical history, past social history, past surgical history and problem list.  Review of Systems ROS   Objective:   There were no vitals taken for this visit. CONSTITUTIONAL: Well-developed, well-nourished female in no acute distress.  PSYCHIATRIC: Normal mood and affect. Normal behavior. Normal judgment and thought content. NEUROLGIC: Alert and oriented to person, place, and time. Normal muscle tone coordination. No cranial nerve deficit noted. HENT:  Normocephalic, atraumatic, External right and left ear normal. Oropharynx is clear and moist EYES: Conjunctivae and EOM are normal. Pupils are equal, round, and reactive to light. No scleral icterus.  NECK: Normal range of motion, supple, no masses.  Normal thyroid.  SKIN: Skin is warm and dry. No rash noted. Not diaphoretic. No erythema. No pallor. CARDIOVASCULAR: Normal heart rate noted, regular rhythm, no murmur. RESPIRATORY: Clear to auscultation bilaterally. Effort and breath sounds normal, no problems with respiration noted. BREASTS: Symmetric in size. No masses, skin changes, nipple drainage, or lymphadenopathy. ABDOMEN: Soft, normal bowel sounds, no distention noted.  No tenderness, rebound or guarding.  BLADDER: Normal PELVIC:  External Genitalia: Normal  BUS: Normal  Vagina: Normal  Cervix: Normal; IUD strings 3 cm  Uterus: Normal; top normal size, mobile, nontender  Adnexa: Normal; nonpalpable nontender  RV: External Exam NormaI  MUSCULOSKELETAL: Normal range of motion. No tenderness.  No cyanosis, clubbing, or edema.  2+ distal pulses. LYMPHATIC: No Axillary, Supraclavicular, or Inguinal Adenopathy.    Assessment:   Annual gynecologic examination 41 y.o. Contraception: IUD Obesity 2 Problem List Items Addressed This Visit    Adenomyosis    Other Visit Diagnoses    Well woman exam with routine gynecological exam    -  Primary   Fatigue, unspecified type        Menorrhagia with irregular cycle        Adenomyosis, minimally symptomatic with Mirena IUD Chronic fatigue, unclear etiology  Plan:  Pap: due 2019 Mammogram: due age 41 Stool Guaiac Testing:  Not Indicated Labs: CBC, Lipid 1, FBS, TSH, Hemoglobin A1C and Vit D Level"". Routine preventative health maintenance measures emphasized: Exercise/Diet/Weight control, Tobacco Warnings and Alcohol/Substance use risks Return to Clinic - 1 Year   Darol Destinerystal Cha [place], [place]   Note: This dictation was prepared with Dragon dictation along with smaller phrase technology. Any transcriptional errors that result from this process are unintentional.

## 2016-11-09 ENCOUNTER — Encounter: Payer: Managed Care, Other (non HMO) | Admitting: Obstetrics and Gynecology

## 2016-12-04 IMAGING — CT CT ABD-PELV W/ CM
1 of 2 series · 16 of 32 positions shown, 20 images · IV contrast (omnipaque)
Comparison: 08/20/2014 pelvic ultrasound

CLINICAL DATA: Pelvic pain.

EXAM:
CT ABDOMEN AND PELVIS WITH CONTRAST
TECHNIQUE: Multidetector CT imaging of the abdomen and pelvis was performed
using the standard protocol following bolus administration of
intravenous contrast.
CONTRAST:  100mL OMNIPAQUE IOHEXOL 300 MG/ML  SOLN

[Series 2: routine abd pel with · axial · 0.98mm/px · z∈[-1092,-636]mm · 16 of 101 slices shown, 20 images]
[im 5/101  soft-tissue]
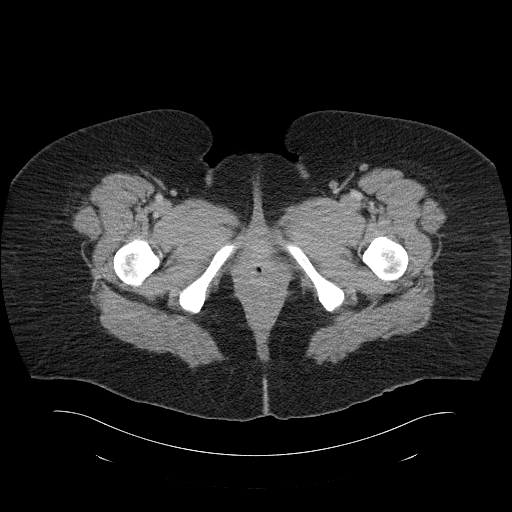
[im 5/101  bone]
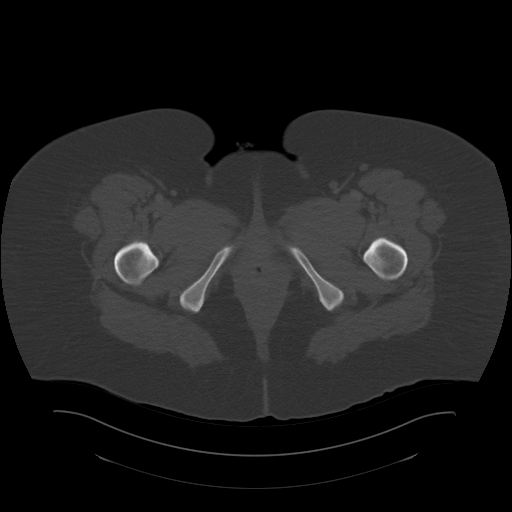
[im 13/101  soft-tissue]
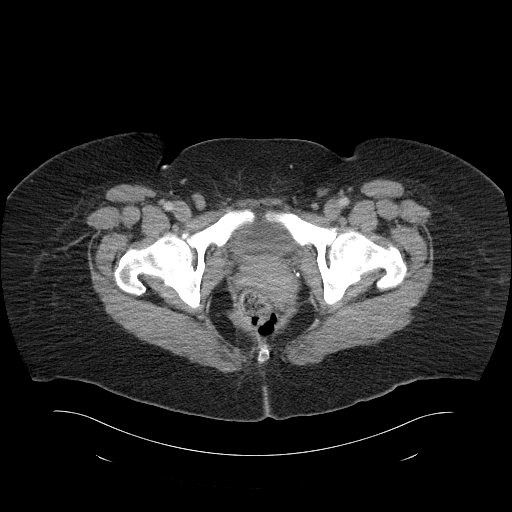
[im 21/101  soft-tissue]
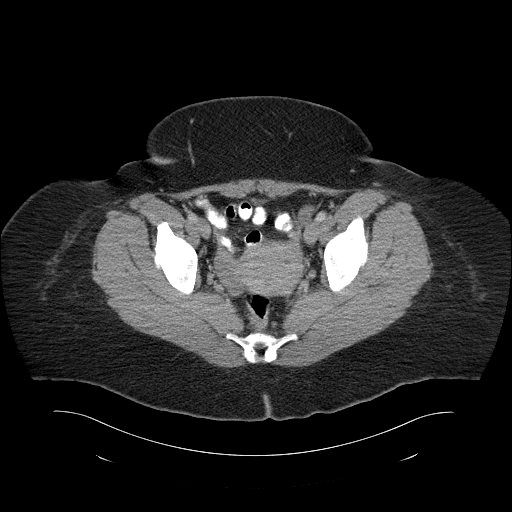
[im 26/101  soft-tissue]
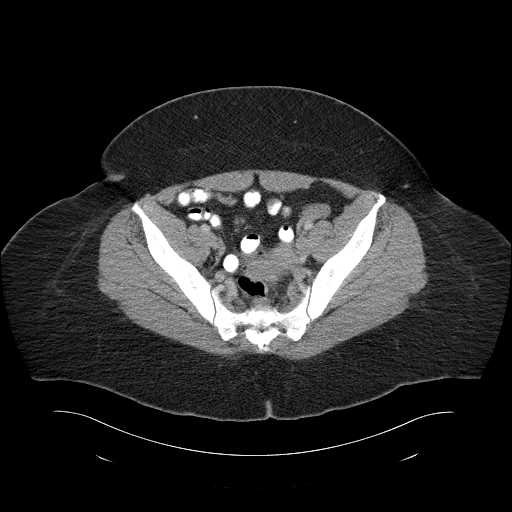
[im 34/101  soft-tissue]
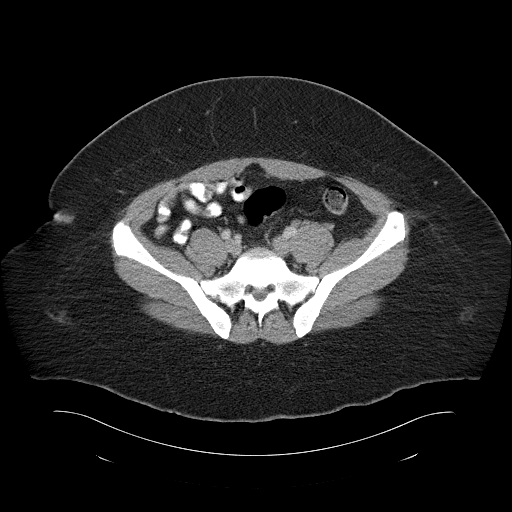
[im 42/101  soft-tissue]
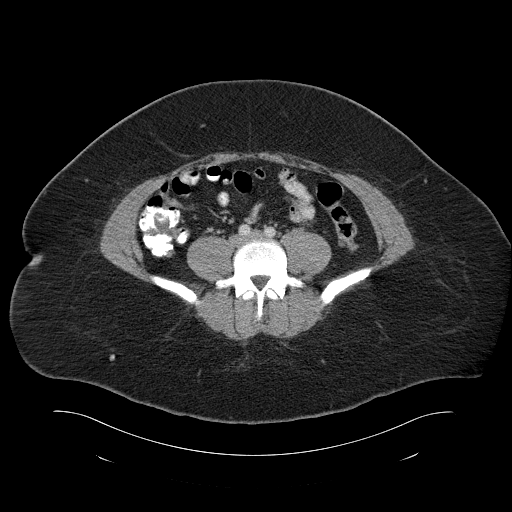
[im 46/101  soft-tissue]
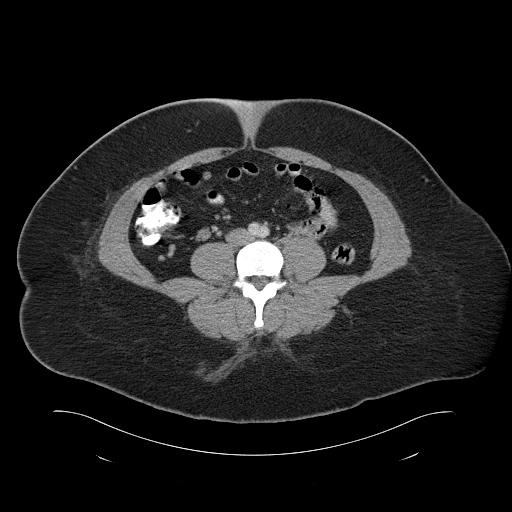
[im 55/101  soft-tissue]
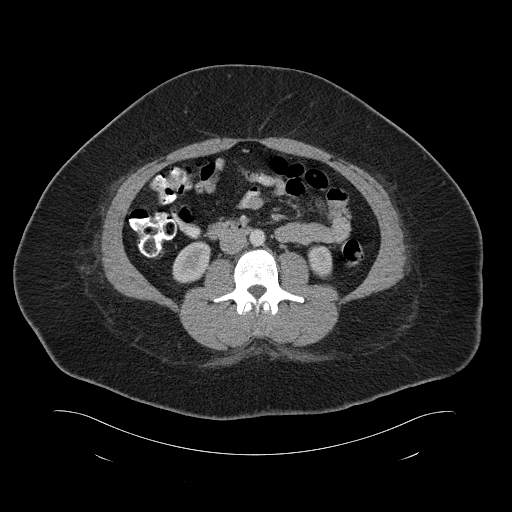
[im 59/101  soft-tissue]
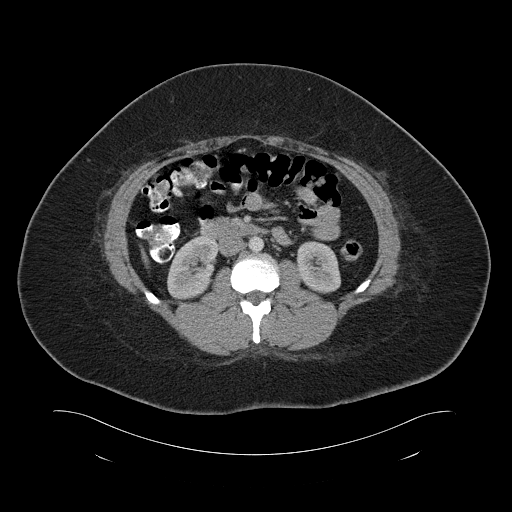
[im 59/101  bone]
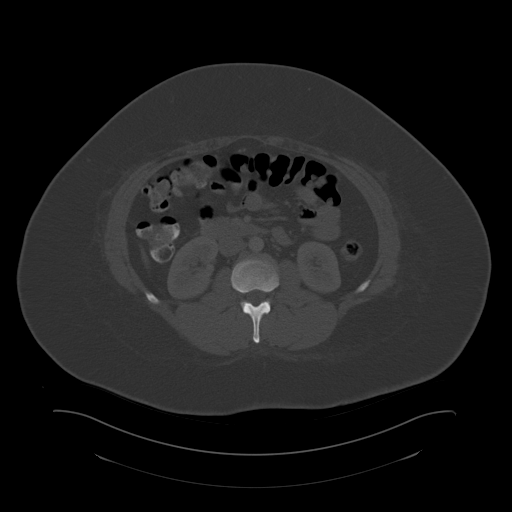
[im 67/101  soft-tissue]
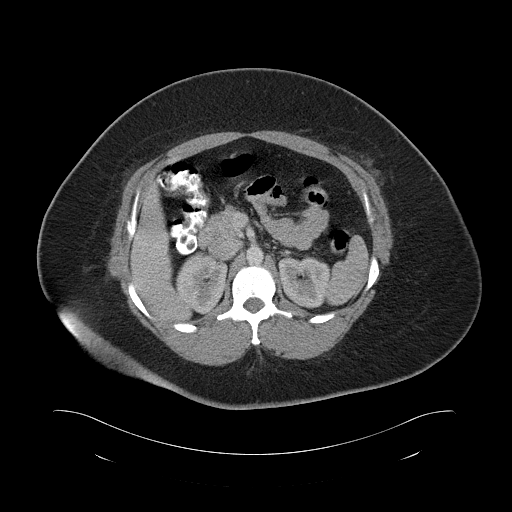
[im 76/101  soft-tissue]
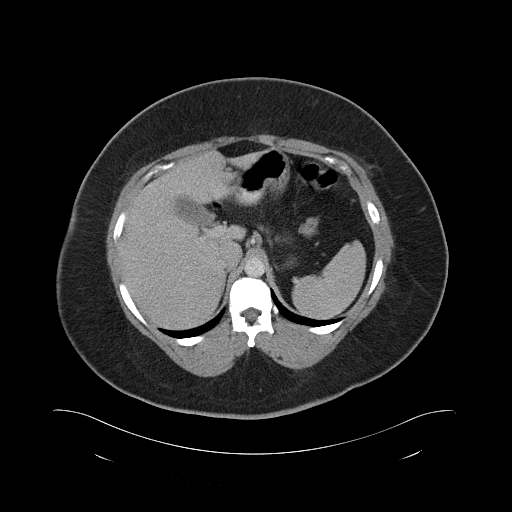
[im 80/101  soft-tissue]
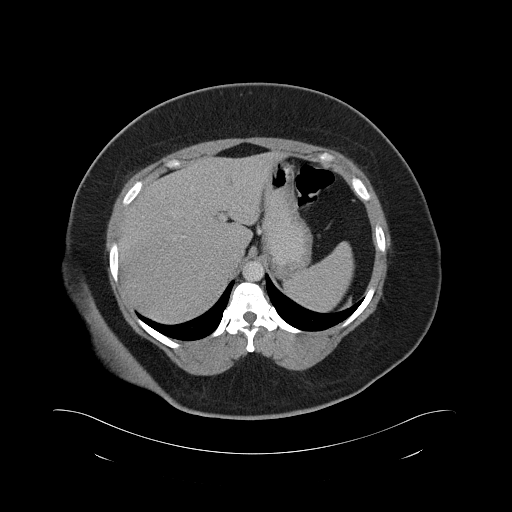
[im 84/101  lung]
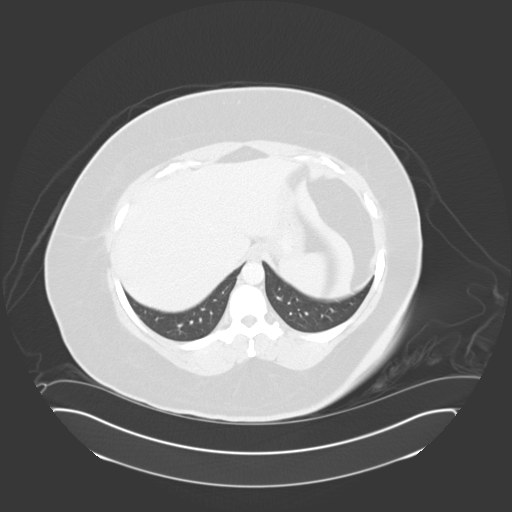
[im 88/101  soft-tissue]
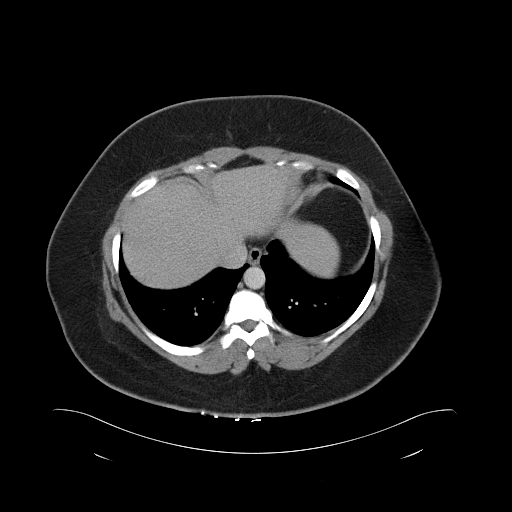
[im 88/101  lung]
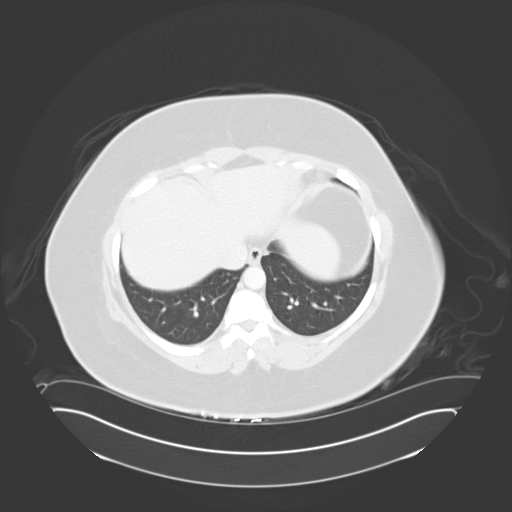
[im 92/101  lung]
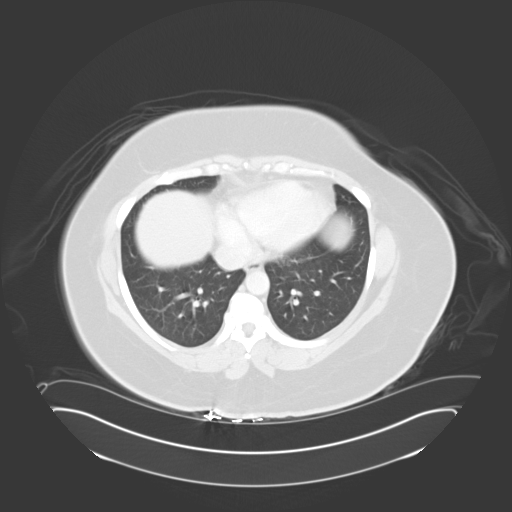
[im 96/101  soft-tissue]
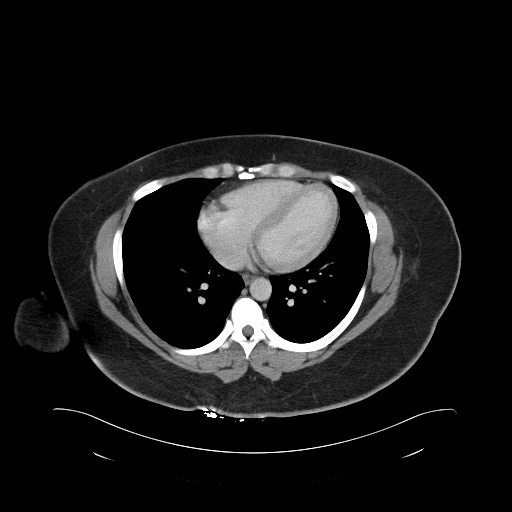
[im 96/101  lung]
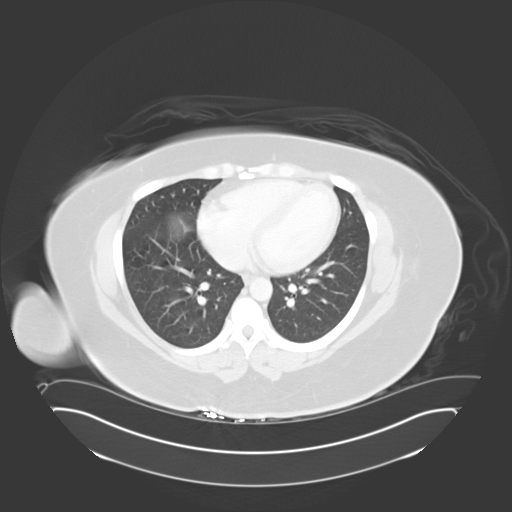

[16 of 32 positions shown; findings below may reference images not displayed]

FINDINGS: Lower chest:  Unremarkable

Hepatobiliary: Unremarkable

Pancreas: Unremarkable

Spleen: Unremarkable

Adrenals/Urinary Tract: Unremarkable

Stomach/Bowel: Unremarkable.  Appendix normal.

Vascular/Lymphatic: Unremarkable

Reproductive: Uterine and ovarian contours normal.

Other: Unremarkable

Musculoskeletal: Unremarkable
IMPRESSION: 1. Normal CT appearance of the abdomen and pelvis. Some entities
which can cause pelvic pain, such as endometriosis, might not be
visible at imaging.

## 2016-12-06 ENCOUNTER — Encounter: Payer: Self-pay | Admitting: Radiology

## 2016-12-06 ENCOUNTER — Ambulatory Visit
Admission: RE | Admit: 2016-12-06 | Discharge: 2016-12-06 | Disposition: A | Discharge status: Home / Self Care | Payer: Managed Care, Other (non HMO) | Source: Ambulatory Visit | Attending: Obstetrics and Gynecology | Admitting: Obstetrics and Gynecology

## 2016-12-06 DIAGNOSIS — N6320 Unspecified lump in the left breast, unspecified quadrant: Secondary | ICD-10-CM | POA: Diagnosis present

## 2016-12-06 DIAGNOSIS — N6452 Nipple discharge: Secondary | ICD-10-CM | POA: Insufficient documentation

## 2016-12-11 ENCOUNTER — Other Ambulatory Visit: Payer: Self-pay | Admitting: Obstetrics and Gynecology

## 2017-02-08 ENCOUNTER — Encounter: Payer: Self-pay | Admitting: Family Medicine

## 2017-02-08 ENCOUNTER — Ambulatory Visit: Payer: Self-pay | Admitting: Family Medicine

## 2017-02-08 VITALS — BP 110/80 | HR 105 | Temp 98.2°F | Resp 16

## 2017-02-08 DIAGNOSIS — J069 Acute upper respiratory infection, unspecified: Secondary | ICD-10-CM

## 2017-02-08 MED ORDER — FLUTICASONE PROPIONATE 50 MCG/ACT NA SUSP: 2.0000 | Freq: Every day | NASAL | 3 refills | Status: DC

## 2017-02-08 NOTE — Progress Notes (Signed)
Patient ID: Michele Hansen, female    DOB: [birth year]  Age: 42 y.o. MRN: [id number]  Chief Complaint  Patient presents with  . URI    Subjective:   Patient has had a respiratory tract infection since Friday.  She developed head congestion and drainage.  Then she got a cough over the next couple of days.  She had some chills.  Did not document a fever.  She did have her flu shot this year.  She does not smoke.  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  She has been taking her OTC Claritin which she does regularly anyhow.  She is not diabetic, is on metformin because of PCO S.  Current allergies, medications, problem list, past/family and social histories reviewed.  Objective:  BP 110/80   Pulse (!) 105   Temp 98.2 F (36.8 C) (Oral)   Resp 16   SpO2 97%   Pleasant lady, alert and oriented, obese.  TMs normal.  Throat clear.  Nose is a little congested.  Neck supple without significant nodes.  Chest clear to auscultation.  Heart regular without murmurs.  Assessment & Plan:   Assessment: 1. Viral upper respiratory infection       Plan: Viral cold illness.  Rest today and return to work tomorrow per her desire.  No orders of the defined types were placed in this encounter.   No orders of the defined types were placed in this encounter.        Patient Instructions  Drink plenty of fluids and get enough rest.  Consider taking Claritin-D rather than plain Claritin while you are sick.  Use the benzonatate cough pills that you have when you go back to work.  Take OTC Robitussin-DM or Mucinex DM if needed to suppress cough.  In the meanwhile just continue using your plain Robitussin in order to keep the secretions thinner.  Use the Flonase (fluticasone) nose spray 2 sprays each nostril twice daily for about for 5 days, then decrease to once daily.  Because you are bringing up the purulent phlegm I am giving you a prescription for a azithromycin, but before that you  wait a few more days to see if you are getting better before taking this.  If you do not use the antibiotic prescription please just destroy it.  You should not keep antibiotics around for self treatment.  Rechecked if worse such as increased fevers, worse cough, chest wall pains, shortness of breath, etc.   Upper Respiratory Infection, Adult Most upper respiratory infections (URIs) are caused by a virus. A URI affects the nose, throat, and upper air passages. The most common type of URI is often called "the common cold." Follow these instructions at home:  Take medicines only as told by your doctor.  Gargle warm saltwater or take cough drops to comfort your throat as told by your doctor.  Use a warm mist humidifier or inhale steam from a shower to increase air moisture. This may make it easier to breathe.  Drink enough fluid to keep your pee (urine) clear or pale yellow.  Eat soups and other clear broths.  Have a healthy diet.  Rest as needed.  Go back to work when your fever is gone or your doctor says it is okay. ? You may need to stay home longer to avoid giving your URI to others. ? You can also wear a face mask and wash your hands often to prevent spread of the virus.  Use your inhaler more if you have asthma.  Do not use any tobacco products, including cigarettes, chewing tobacco, or electronic cigarettes. If you need help quitting, ask your doctor. Contact a doctor if:  You are getting worse, not better.  Your symptoms are not helped by medicine.  You have chills.  You are getting more short of breath.  You have brown or red mucus.  You have yellow or brown discharge from your nose.  You have pain in your face, especially when you bend forward.  You have a fever.  You have puffy (swollen) neck glands.  You have pain while swallowing.  You have white areas in the back of your throat. Get help right away if:  You have very bad or  constant: ? Headache. ? Ear pain. ? Pain in your forehead, behind your eyes, and over your cheekbones (sinus pain). ? Chest pain.  You have long-lasting (chronic) lung disease and any of the following: ? Wheezing. ? Long-lasting cough. ? Coughing up blood. ? A change in your usual mucus.  You have a stiff neck.  You have changes in your: ? Vision. ? Hearing. ? Thinking. ? Mood. This information is not intended to replace advice given to you by your health care provider. Make sure you discuss any questions you have with your health care provider. Document Released: 06/09/2007 Document Revised: 08/24/2015 Document Reviewed: 03/28/2013 Elsevier Interactive Patient Education  [employer].     Return if symptoms worsen or fail to improve.   Yoselyn Mcglade, MD 02/08/2017

## 2017-02-08 NOTE — Patient Instructions (Signed)
Drink plenty of fluids and get enough rest.  Consider taking Claritin-D rather than plain Claritin while you are sick.  Use the benzonatate cough pills that you have when you go back to work.  Take OTC Robitussin-DM or Mucinex DM if needed to suppress cough.  In the meanwhile just continue using your plain Robitussin in order to keep the secretions thinner.  Use the Flonase (fluticasone) nose spray 2 sprays each nostril twice daily for about for 5 days, then decrease to once daily.  Because you are bringing up the purulent phlegm I am giving you a prescription for a azithromycin, but before that you wait a few more days to see if you are getting better before taking this.  If you do not use the antibiotic prescription please just destroy it.  You should not keep antibiotics around for self treatment.  Rechecked if worse such as increased fevers, worse cough, chest wall pains, shortness of breath, etc.   Upper Respiratory Infection, Adult Most upper respiratory infections (URIs) are caused by a virus. A URI affects the nose, throat, and upper air passages. The most common type of URI is often called "the common cold." Follow these instructions at home:  Take medicines only as told by your doctor.  Gargle warm saltwater or take cough drops to comfort your throat as told by your doctor.  Use a warm mist humidifier or inhale steam from a shower to increase air moisture. This may make it easier to breathe.  Drink enough fluid to keep your pee (urine) clear or pale yellow.  Eat soups and other clear broths.  Have a healthy diet.  Rest as needed.  Go back to work when your fever is gone or your doctor says it is okay. ? You may need to stay home longer to avoid giving your URI to others. ? You can also wear a face mask and wash your hands often to prevent spread of the virus.  Use your inhaler more if you have asthma.  Do not use any tobacco products, including cigarettes, chewing  tobacco, or electronic cigarettes. If you need help quitting, ask your doctor. Contact a doctor if:  You are getting worse, not better.  Your symptoms are not helped by medicine.  You have chills.  You are getting more short of breath.  You have brown or red mucus.  You have yellow or brown discharge from your nose.  You have pain in your face, especially when you bend forward.  You have a fever.  You have puffy (swollen) neck glands.  You have pain while swallowing.  You have white areas in the back of your throat. Get help right away if:  You have very bad or constant: ? Headache. ? Ear pain. ? Pain in your forehead, behind your eyes, and over your cheekbones (sinus pain). ? Chest pain.  You have long-lasting (chronic) lung disease and any of the following: ? Wheezing. ? Long-lasting cough. ? Coughing up blood. ? A change in your usual mucus.  You have a stiff neck.  You have changes in your: ? Vision. ? Hearing. ? Thinking. ? Mood. This information is not intended to replace advice given to you by your health care provider. Make sure you discuss any questions you have with your health care provider. Document Released: 06/09/2007 Document Revised: 08/24/2015 Document Reviewed: 03/28/2013 Elsevier Interactive Patient Education  2018 [employer]

## 2017-02-10 ENCOUNTER — Other Ambulatory Visit: Payer: Self-pay | Admitting: Medical

## 2017-02-10 NOTE — Progress Notes (Signed)
Patient came in to have blood drawn for testing per biometric screening requirements.  Patient was sent to [facility] draw station for collection.  Patient is a very hard stick and I was unable to find a vein.

## 2017-03-07 ENCOUNTER — Ambulatory Visit: Payer: Self-pay

## 2017-05-04 ENCOUNTER — Ambulatory Visit: Payer: Self-pay | Admitting: Family Medicine

## 2017-05-04 VITALS — BP 133/89 | HR 65 | Resp 16 | Ht 68.0 in | Wt 310.0 lb

## 2017-05-04 DIAGNOSIS — Z008 Encounter for other general examination: Secondary | ICD-10-CM

## 2017-05-04 DIAGNOSIS — Z0189 Encounter for other specified special examinations: Principal | ICD-10-CM

## 2017-05-04 LAB — GLUCOSE, POCT (MANUAL RESULT ENTRY): POC Glucose: 103 mg/dl — AB (ref 70–99)

## 2017-05-04 NOTE — Progress Notes (Signed)
Subjective: Annual biometrics screening  Patient presents for her annual biometric screening. Patient reports a history of PCOS, denies diabetes or prediabetes.  Patient is currently in nurse practitioner school.  Patient denies any symptoms or concerns currently. PCP: Dr. 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  Review of Systems Unremarkable  Objective  Physical Exam General: Awake, alert and oriented. No acute distress. Well developed, hydrated and nourished. Appears stated age.  HEENT: Supple neck without adenopathy. Sclera is non-icteric. The ear canal is clear without discharge. The tympanic membrane is normal in appearance with normal landmarks and cone of light. Nasal mucosa is pink and moist. Oral mucosa is pink and moist. The pharynx is normal in appearance without tonsillar swelling or exudates.  Skin: Skin in warm, dry and intact without rashes or lesions. Appropriate color for ethnicity. Cardiac: Heart rate and rhythm are normal. No murmurs, gallops, or rubs are auscultated.  Respiratory: The chest wall is symmetric and without deformity. No signs of respiratory distress. Lung sounds are clear in all lobes bilaterally without rales, ronchi, or wheezes.  Neurological: The patient is awake, alert and oriented to person, place, and time with normal speech.  Memory is normal and thought processes intact. No gait abnormalities are appreciated.  Psychiatric: Appropriate mood and affect.   Assessment Annual biometrics screening  Plan  Lipid panel pending. Encouraged routine visits with primary care provider.  Fasting blood sugar is 103 today.  Advised patient to follow-up with her primary care provider regarding this.

## 2017-05-05 LAB — LIPID PANEL
CHOLESTEROL TOTAL: 158 mg/dL (ref 100–199)
Chol/HDL Ratio: 3.2 ratio (ref 0.0–4.4)
HDL: 49 mg/dL (ref >39)
LDL Calculated: 94 mg/dL (ref 0–99)
TRIGLYCERIDES: 76 mg/dL (ref 0–149)
VLDL Cholesterol Cal: 15 mg/dL (ref 5–40)

## 2017-11-03 ENCOUNTER — Other Ambulatory Visit: Payer: Self-pay | Admitting: Obstetrics and Gynecology

## 2017-11-07 ENCOUNTER — Other Ambulatory Visit: Payer: Self-pay | Admitting: Obstetrics and Gynecology

## 2018-01-23 ENCOUNTER — Telehealth: Payer: Self-pay | Admitting: Surgical

## 2018-01-23 NOTE — Telephone Encounter (Signed)
LM for patient to return call to schedule annual exam.

## 2018-01-31 ENCOUNTER — Encounter (INDEPENDENT_AMBULATORY_CARE_PROVIDER_SITE_OTHER): Payer: Self-pay

## 2018-02-06 ENCOUNTER — Encounter (INDEPENDENT_AMBULATORY_CARE_PROVIDER_SITE_OTHER): Payer: Self-pay | Admitting: Family Medicine

## 2018-02-06 ENCOUNTER — Ambulatory Visit (INDEPENDENT_AMBULATORY_CARE_PROVIDER_SITE_OTHER): Payer: Managed Care, Other (non HMO) | Admitting: Family Medicine

## 2018-02-06 VITALS — BP 119/78 | HR 69 | Temp 98.0°F | Ht 67.0 in | Wt 299.0 lb

## 2018-02-06 DIAGNOSIS — F3289 Other specified depressive episodes: Secondary | ICD-10-CM

## 2018-02-06 DIAGNOSIS — R5383 Other fatigue: Secondary | ICD-10-CM | POA: Diagnosis not present

## 2018-02-06 DIAGNOSIS — R0602 Shortness of breath: Secondary | ICD-10-CM

## 2018-02-06 DIAGNOSIS — Z9189 Other specified personal risk factors, not elsewhere classified: Secondary | ICD-10-CM

## 2018-02-06 DIAGNOSIS — E282 Polycystic ovarian syndrome: Secondary | ICD-10-CM

## 2018-02-06 DIAGNOSIS — Z0289 Encounter for other administrative examinations: Secondary | ICD-10-CM

## 2018-02-06 DIAGNOSIS — E559 Vitamin D deficiency, unspecified: Secondary | ICD-10-CM | POA: Diagnosis not present

## 2018-02-06 DIAGNOSIS — Z6841 Body Mass Index (BMI) 40.0 and over, adult: Secondary | ICD-10-CM

## 2018-02-06 NOTE — Progress Notes (Signed)
Office: [phone]  /  Fax: [phone]   Dear Dr. Maxie BetterSheronette Hansen,    Thank you for referring Michele Hansen to our clinic. The following note includes my evaluation and treatment recommendations.  HPI:   Chief Complaint: OBESITY    Michele Hansen has been referred by Dr. Maxie BetterSheronette Hansen for consultation regarding her obesity and obesity related comorbidities.    Michele Hansen (MR# [id number]) is a 43 y.o. female who presents on 02/06/2018 for obesity evaluation and treatment. Current BMI is Body mass index is 46.83 kg/m.  Michele Hansen has been struggling with her weight for many years and has been unsuccessful in either losing weight, maintaining weight loss, or reaching her healthy weight goal.     Michele Hansen attended our information session and states she is currently in the action stage of change and ready to dedicate time achieving and maintaining a healthier weight. Michele Hansen is interested in becoming our patient and working on intensive lifestyle modifications including (but not limited to) diet, exercise and weight loss.    Michele Hansen states her family eats meals together she thinks her family will eat healthier with her she could possibly struggle with family and or coworkers weight loss sabotage her desired weight loss is 74 lbs she has been heavy most of her life she started gaining weight at about 32 her heaviest weight ever was 299 lbs. she is somewhat of a picky eater and doesn't like to eat healthier foods  she has significant food cravings issues  she snacks frequently in the evenings she wakes up frequently in the middle of the night to eat she skips meals frequently she is frequently drinking liquids with calories she frequently makes poor food choices she has problems with excessive hunger  she has binge eating behaviors she struggles with emotional eating    Fatigue Michele Hansen feels her energy is lower than it should be. This has worsened with weight gain and has not  worsened recently. Michele Hansen admits to daytime somnolence and  admits to waking up still tired. Patient is at risk for obstructive sleep apnea. Patent has a history of symptoms of daytime fatigue and morning headache. Patient generally gets 3 or 4 hours of sleep per night, and states they generally have restless sleep. Snoring is present. Apneic episodes are not present. Epworth Sleepiness Score is 8.  Dyspnea on exertion Michele Hansen notes increasing shortness of breath with exercising and seems to be worsening over time with weight gain. She notes getting out of breath sooner with activity than she used to. This has not gotten worse recently. Michele Hansen denies orthopnea.  Polycystic Ovarian Syndrome Michele Hansen has polycystic ovarian syndrome and is on metformin. She is supposed to take it BID, but has GI upset, so she only takes it in the evening. She would like to get pregnant.  At risk for diabetes Michele Hansen is at higher than average risk for developing diabetes due to her polycystic ovarian syndrome and obesity. She currently denies polyuria or polydipsia.  Vitamin D deficiency Michele Hansen has a diagnosis of vitamin D deficiency. She is currently taking OTC vit D and admits fatigue.  Depression with emotional eating behaviors Michele Hansen has a positive PHQ-9 and admits to comfort eating and feeling overwhelmed at this. She is struggling with emotional eating and using food for comfort to the extent that it is negatively impacting her health. She often snacks when she is not hungry. Michele Hansen sometimes feels she is out of control and then feels guilty that she made poor  food choices. She has been working on behavior modification techniques to help reduce her emotional eating and has been somewhat successful. She shows no sign of suicidal or homicidal ideations.  Depression Screen Michele Hansen Food and Mood (modified PHQ-9) score was 19. Depression screen PHQ 2/9 02/06/2018  Decreased Interest 2  Down, Depressed, Hopeless 2  PHQ  - 2 Score 4  Altered sleeping 3  Tired, decreased energy 3  Change in appetite 3  Feeling bad or failure about yourself  3  Trouble concentrating 3  Moving slowly or fidgety/restless 0  Suicidal thoughts 0  PHQ-9 Score 19  Difficult doing work/chores Somewhat difficult   ASSESSMENT AND PLAN:  Other fatigue - Plan: EKG 12-Lead, Vitamin B12, CBC With Differential, Folate, T3, T4, free, TSH  Shortness of breath on exertion - Plan: Lipid Panel With LDL/HDL Ratio  PCOS (polycystic ovarian syndrome) - Plan: Comprehensive metabolic panel, Hemoglobin A1c, Insulin, random  Vitamin D deficiency - Plan: VITAMIN D 25 Hydroxy (Vit-D Deficiency, Fractures)  Other depression - with emotional eating  At risk for diabetes mellitus  Class 3 severe obesity with serious comorbidity and body mass index (BMI) of 45.0 to 49.9 in adult, unspecified obesity type (HCC)  PLAN:  Fatigue Michele Hansen was informed that her fatigue may be related to obesity, depression or many other causes. Labs will be ordered, and in the meanwhile Michele Hansen has agreed to work on diet, exercise and weight loss to help with fatigue. Proper sleep hygiene was discussed including the need for 7-8 hours of quality sleep each night. A sleep study was not ordered based on symptoms and Epworth score. An EKG and an indirect calorimetry was ordered today.  Dyspnea on exertion Michele Hansen's shortness of breath appears to be obesity related and exercise induced. She has agreed to work on weight loss and gradually increase exercise to treat her exercise induced shortness of breath. If Michele Hansen follows our instructions and loses weight without improvement of her shortness of breath, we will plan to refer to pulmonology. We will monitor this condition regularly. We ordered an indirect calorimetry, labs, and an EKG today. Michele Hansen agrees to this plan.  Polycystic Ovarian Syndrome Michele Hansen agrees to continue her diet, weight loss, and will follow up as directed  in 2 weeks.  Diabetes risk counseling Michele Hansen was given extended (15 minutes) diabetes prevention counseling today. She is 43 y.o. female and has risk factors for diabetes including polycystic ovarian syndrome and obesity. We discussed intensive lifestyle modifications today with an emphasis on weight loss as well as increasing exercise and decreasing simple carbohydrates in her diet.  Vitamin D Deficiency Michele Hansen was informed that low vitamin D levels contributes to fatigue and are associated with obesity, breast, and colon cancer. We ordered a vitamin D level today and she agrees to follow up in 2 weeks.  Depression with Emotional Eating Behaviors We discussed behavior modification techniques today to help Michele Hansen deal with her emotional eating and depression. Patient was referred to Dr. Dewaine Conger, our bariatric psychologist for evaluation in 2 weeks due to elevated PHQ-9 score and significant struggles with emotional eating.   Depression Screen Michele Hansen had a strongly positive depression screening. Depression is commonly associated with obesity and often results in emotional eating behaviors. We will monitor this closely and work on CBT to help improve the non-hunger eating patterns. Referral to Psychology may be required if no improvement is seen as she continues in our clinic.  Obesity Michele Hansen is currently in the action stage of change and  her goal is to continue with weight loss efforts. I recommend Caitlain begin the structured treatment plan as follows:  She has agreed to follow the Category 3 plan. Damya has been instructed to eventually work up to a goal of 150 minutes of combined cardio and strengthening exercise per week for weight loss and overall health benefits. We discussed the following Behavioral Modification Strategies today: increasing lean protein intake, decreasing simple carbohydrates, and work on meal planning and easy cooking plans.   She was informed of the importance of frequent  follow up visits to maximize her success with intensive lifestyle modifications for her multiple health conditions. She was informed we would discuss her lab results at her next visit unless there is a critical issue that needs to be addressed sooner. Michele Hansen agreed to keep her next visit at the agreed upon time to discuss these results.  ALLERGIES: Allergies  Allergen Reactions  . Banana Anaphylaxis and Nausea And Vomiting  . Latex Itching and Rash  . Avocado   . Citrus   . Date Seed Extract [Zizyphus Jujuba]   . Tape     MEDICATIONS: Current Outpatient Medications on File Prior to Visit  Medication Sig Dispense Refill  . acetaminophen (TYLENOL) 500 MG tablet Take 500 mg by mouth every 6 (six) hours as needed.    . calcium carbonate (TUMS - DOSED IN MG ELEMENTAL CALCIUM) 500 MG chewable tablet Chew 1 tablet by mouth daily.    . cholecalciferol (VITAMIN D3) 25 MCG (1000 UT) tablet Take 1,000 Units by mouth daily.    [name]. ibuprofen (ADVIL,MOTRIN) 800 MG tablet Take 800 mg by mouth every 8 (eight) hours as needed.    . metFORMIN (GLUCOPHAGE-XR) 500 MG 24 hr tablet TAKE 1 TABLET BY MOUTH TWICE A DAY (Patient taking differently: Take 500 mg by mouth daily with breakfast. ) 60 tablet 1  . vitamin E 400 UNIT capsule Take 400 Units by mouth daily.    . naproxen (NAPROSYN) 500 MG tablet Reported on 06/25/2015  0   No current facility-administered medications on file prior to visit.     PAST MEDICAL HISTORY: Past Medical History:  Diagnosis Date  . Abnormal uterine bleeding (AUB)   . ADD (attention deficit disorder)   . Allergies   . AR (allergic rhinitis)   . Asthma   . Constipation   . Depression   . Endometriosis 2014  . Family history of breast cancer   . Fatigue   . Heavy periods   . Infertility, female   . Irregular periods   . Joint pain   . Knee MCL sprain   . Lactose intolerance   . Multiple food allergies    Bananas, Dates, Avocado, Citrus  . Painful menstrual periods     . Polycystic ovarian disease 2010  . Stress   . Vitamin D deficiency   . Yeast infection     PAST SURGICAL HISTORY: Past Surgical History:  Procedure Laterality Date  . BREAST CYST EXCISION Right 2015   [facility] Removed cyst.   . BREAST SURGERY Bilateral 2004   breast reduction  . cryotherapy    . CYSTECTOMY Right 2015   breast  . DILATION AND CURETTAGE OF UTERUS    . HYSTEROSCOPY W/D&C N/A 05/13/2014   Procedure: DILATATION AND CURETTAGE /HYSTEROSCOPY;  Surgeon: Herold HarmsMartin A Defrancesco, MD;  Location: [facility] ORS;  Service: Gynecology;  Laterality: N/A;  . MASS EXCISION N/A 2010   from uterus  . REDUCTION MAMMAPLASTY Bilateral 2004  Reduction     SOCIAL HISTORY: Social History   Tobacco Use  . Smoking status: Never Smoker  . Smokeless tobacco: Never Used  Substance Use Topics  . Alcohol use: Yes    Comment: occas  . Drug use: No    FAMILY HISTORY: Family History  Problem Relation Age of Onset  . Diabetes Father   . Colon cancer Father   . Obesity Father   . Breast cancer Mother   . Hyperlipidemia Mother   . Depression Maternal Grandfather   . Breast cancer Paternal Grandmother   . Heart disease Neg Hx   . Ovarian cancer Neg Hx     ROS: Review of Systems  Constitutional: Positive for malaise/fatigue. Negative for weight loss.  Eyes:       Wears glasses or contacts.  Respiratory: Positive for shortness of breath.   Cardiovascular: Negative for orthopnea.  Genitourinary:       Negative for polyuria.  Musculoskeletal: Positive for joint pain and myalgias.  Endo/Heme/Allergies: Negative for polydipsia.  Psychiatric/Behavioral: Positive for depression. Negative for suicidal ideas. The patient has insomnia.        Positive for stress. Negative for homicidal ideations.    PHYSICAL EXAM: Blood pressure 119/78, pulse 69, temperature 98 F (36.7 C), temperature source Oral, height 5\' 7"  (1.702 m), weight 299 lb (135.6 kg), last menstrual period 01/10/2018, SpO2 100  %. Body mass index is 46.83 kg/m. Physical Exam Vitals signs reviewed.  Constitutional:      Appearance: Normal appearance. She is obese.  HENT:     Head: Normocephalic and atraumatic.     Nose: Nose normal.  Eyes:     General: No scleral icterus.    Extraocular Movements: Extraocular movements intact.  Neck:     Musculoskeletal: Normal range of motion and neck supple.     Comments: Moderately diffuse goiter noted.  Cardiovascular:     Rate and Rhythm: Normal rate and regular rhythm.  Pulmonary:     Effort: Pulmonary effort is normal. No respiratory distress.  Abdominal:     Palpations: Abdomen is soft.     Tenderness: There is no abdominal tenderness.     Comments: Positive for obesity.  Musculoskeletal:     Comments: ROM normal in all extremities.  Skin:    General: Skin is warm and dry.     Comments: Positive for acutosis.  Neurological:     Mental Status: She is alert and oriented to person, place, and time.     Coordination: Coordination normal.  Psychiatric:        Mood and Affect: Mood normal.        Behavior: Behavior normal.    RECENT LABS AND TESTS: BMET    Component Value Date/Time   NA 138 11/07/2015 0900   NA 139 01/20/2011 0606   K 4.8 11/07/2015 0900   K 4.1 01/20/2011 0606   CL 100 11/07/2015 0900   CL 108 (H) 01/20/2011 0606   CO2 21 (L) 08/20/2014 1036   CO2 23 01/20/2011 0606   GLUCOSE 84 11/07/2015 0900   GLUCOSE 73 08/20/2014 1036   GLUCOSE 84 01/20/2011 0606   BUN 11 11/07/2015 0900   BUN 10 01/20/2011 0606   CREATININE 0.84 11/07/2015 0900   CREATININE 0.83 01/20/2011 0606   CALCIUM 9.5 11/07/2015 0900   CALCIUM 8.9 01/20/2011 0606   GFRNONAA 88 11/07/2015 0900   GFRNONAA >60 01/20/2011 0606   GFRAA 102 11/07/2015 0900   GFRAA >60 01/20/2011 0606  Lab Results  Component Value Date   HGBA1C 4.9 11/07/2015   No results found for: INSULIN CBC    Component Value Date/Time   WBC 5.5 11/07/2015 0900   WBC 8.5 08/20/2014 1036    RBC 4.63 11/07/2015 0900   RBC 4.00 08/20/2014 1036   HGB 13.4 11/07/2015 0900   HCT 41.3 11/07/2015 0900   PLT 282 11/07/2015 0900   MCV 89 11/07/2015 0900   MCV 90 01/20/2011 0606   MCH 28.9 11/07/2015 0900   MCH 27.5 08/20/2014 1036   MCHC 32.4 11/07/2015 0900   MCHC 31.8 (L) 08/20/2014 1036   RDW 14.3 11/07/2015 0900   RDW 13.5 01/20/2011 0606   LYMPHSABS 2.0 11/07/2015 0900   LYMPHSABS 1.7 01/20/2011 0606   MONOABS 0.6 08/20/2014 1036   MONOABS 0.4 01/20/2011 0606   EOSABS 0.2 11/07/2015 0900   EOSABS 0.1 01/20/2011 0606   BASOSABS 0.0 11/07/2015 0900   BASOSABS 0.0 01/20/2011 0606   Iron/TIBC/Ferritin/ %Sat    Component Value Date/Time   IRON 100 11/07/2015 0900   Lipid Panel     Component Value Date/Time   CHOL 158 05/04/2017 0812   TRIG 76 05/04/2017 0812   HDL 49 05/04/2017 0812   CHOLHDL 3.2 05/04/2017 0812   LDLCALC 94 05/04/2017 0812   Hepatic Function Panel     Component Value Date/Time   PROT 7.7 11/07/2015 0900   PROT 7.7 01/20/2011 0606   ALBUMIN 4.4 11/07/2015 0900   ALBUMIN 3.8 01/20/2011 0606   AST 16 11/07/2015 0900   AST 15 01/20/2011 0606   ALT 15 11/07/2015 0900   ALT 27 01/20/2011 0606   ALKPHOS 65 11/07/2015 0900   ALKPHOS 55 01/20/2011 0606   BILITOT 0.6 11/07/2015 0900   BILITOT 0.4 01/20/2011 0606      Component Value Date/Time   TSH 2.380 11/07/2015 0900   TSH 1.690 06/25/2015 1100   ECG  shows NSR with a rate of 75 BPM. INDIRECT CALORIMETER done today shows a VO2 of 331 and a REE of 2304.  Her calculated basal metabolic rate is 5697 thus her basal metabolic rate is better than expected.  OBESITY BEHAVIORAL INTERVENTION VISIT  Today's visit was # 1   Starting weight: 299 lbs Starting date: 02/06/2018 Today's weight : Weight: 299 lb (135.6 kg)  Today's date: 02/06/2018 Total lbs lost to date: 0  ASK: We discussed the diagnosis of obesity with Michele Norris today and Dyonna agreed to give [name] permission to discuss  obesity behavioral modification therapy today.  ASSESS: Paola has the diagnosis of obesity and her BMI today is 46.8. Elica is in the action stage of change.   ADVISE: Cumi was educated on the multiple health risks of obesity as well as the benefit of weight loss to improve her health. She was advised of the need for long term treatment and the importance of lifestyle modifications to improve her current health and to decrease her risk of future health problems.  AGREE: Multiple dietary modification options and treatment options were discussed and Jeslyn agreed to follow the recommendations documented in the above note.  ARRANGE: Arla was educated on the importance of frequent visits to treat obesity as outlined per CMS and USPSTF guidelines and agreed to schedule her next follow up appointment today.  IKirke Corin, CMA, am acting as transcriptionist for Wilder Glade, MD  I have reviewed the above documentation for accuracy and completeness, and I agree with the above. -Quillian Quince, MD

## 2018-02-07 LAB — FOLATE: Folate: 12.6 ng/mL (ref >3.0)

## 2018-02-07 LAB — COMPREHENSIVE METABOLIC PANEL
ALT: 18 IU/L (ref 0–32)
AST: 14 IU/L (ref 0–40)
Albumin/Globulin Ratio: 1.5 (ref 1.2–2.2)
Albumin: 4.5 g/dL (ref 3.8–4.8)
Alkaline Phosphatase: 56 IU/L (ref 39–117)
BUN/Creatinine Ratio: 12 (ref 9–23)
BUN: 10 mg/dL (ref 6–24)
Bilirubin Total: 0.5 mg/dL (ref 0.0–1.2)
CO2: 19 mmol/L — ABNORMAL LOW (ref 20–29)
Calcium: 9.2 mg/dL (ref 8.7–10.2)
Chloride: 104 mmol/L (ref 96–106)
Creatinine, Ser: 0.85 mg/dL (ref 0.57–1.00)
GFR calc Af Amer: 99 mL/min/{1.73_m2} (ref >59)
GFR calc non Af Amer: 86 mL/min/{1.73_m2} (ref >59)
Globulin, Total: 3 g/dL (ref 1.5–4.5)
Glucose: 74 mg/dL (ref 65–99)
Potassium: 4.6 mmol/L (ref 3.5–5.2)
Sodium: 137 mmol/L (ref 134–144)
Total Protein: 7.5 g/dL (ref 6.0–8.5)

## 2018-02-07 LAB — CBC WITH DIFFERENTIAL
BASOS ABS: 0 10*3/uL (ref 0.0–0.2)
Basos: 1 %
EOS (ABSOLUTE): 0.1 10*3/uL (ref 0.0–0.4)
Eos: 3 %
HEMOGLOBIN: 12.6 g/dL (ref 11.1–15.9)
Hematocrit: 39.6 % (ref 34.0–46.6)
Immature Grans (Abs): 0 10*3/uL (ref 0.0–0.1)
Immature Granulocytes: 0 %
LYMPHS: 41 %
Lymphocytes Absolute: 2.1 10*3/uL (ref 0.7–3.1)
MCH: 28.6 pg (ref 26.6–33.0)
MCHC: 31.8 g/dL (ref 31.5–35.7)
MCV: 90 fL (ref 79–97)
MONOCYTES: 6 %
Monocytes Absolute: 0.3 10*3/uL (ref 0.1–0.9)
NEUTROS PCT: 49 %
Neutrophils Absolute: 2.6 10*3/uL (ref 1.4–7.0)
RBC: 4.41 x10E6/uL (ref 3.77–5.28)
RDW: 13.6 % (ref 11.7–15.4)
WBC: 5.2 10*3/uL (ref 3.4–10.8)

## 2018-02-07 LAB — VITAMIN B12: Vitamin B-12: 616 pg/mL (ref 232–1245)

## 2018-02-07 LAB — LIPID PANEL WITH LDL/HDL RATIO
Cholesterol, Total: 173 mg/dL (ref 100–199)
HDL: 52 mg/dL (ref >39)
LDL Calculated: 105 mg/dL — ABNORMAL HIGH (ref 0–99)
LDl/HDL Ratio: 2 ratio (ref 0.0–3.2)
Triglycerides: 79 mg/dL (ref 0–149)
VLDL Cholesterol Cal: 16 mg/dL (ref 5–40)

## 2018-02-07 LAB — T4, FREE: Free T4: 1.12 ng/dL (ref 0.82–1.77)

## 2018-02-07 LAB — T3: T3, Total: 109 ng/dL (ref 71–180)

## 2018-02-07 LAB — VITAMIN D 25 HYDROXY (VIT D DEFICIENCY, FRACTURES): Vit D, 25-Hydroxy: 19.5 ng/mL — ABNORMAL LOW (ref 30.0–100.0)

## 2018-02-07 LAB — TSH: TSH: 2.37 u[IU]/mL (ref 0.450–4.500)

## 2018-02-07 LAB — INSULIN, RANDOM: INSULIN: 40.6 u[IU]/mL — ABNORMAL HIGH (ref 2.6–24.9)

## 2018-02-07 LAB — HEMOGLOBIN A1C
ESTIMATED AVERAGE GLUCOSE: 94 mg/dL
Hgb A1c MFr Bld: 4.9 % (ref 4.8–5.6)

## 2018-02-16 ENCOUNTER — Ambulatory Visit (INDEPENDENT_AMBULATORY_CARE_PROVIDER_SITE_OTHER): Payer: Self-pay | Admitting: Family Medicine

## 2018-02-20 ENCOUNTER — Ambulatory Visit (INDEPENDENT_AMBULATORY_CARE_PROVIDER_SITE_OTHER): Payer: Managed Care, Other (non HMO) | Admitting: Family Medicine

## 2018-02-20 ENCOUNTER — Ambulatory Visit (INDEPENDENT_AMBULATORY_CARE_PROVIDER_SITE_OTHER): Payer: Self-pay | Admitting: Family Medicine

## 2018-02-20 ENCOUNTER — Encounter (INDEPENDENT_AMBULATORY_CARE_PROVIDER_SITE_OTHER): Payer: Self-pay | Admitting: Family Medicine

## 2018-02-20 VITALS — BP 113/77 | HR 73 | Temp 97.9°F | Ht 67.0 in | Wt 295.0 lb

## 2018-02-20 DIAGNOSIS — E559 Vitamin D deficiency, unspecified: Secondary | ICD-10-CM

## 2018-02-20 DIAGNOSIS — E8881 Metabolic syndrome: Secondary | ICD-10-CM | POA: Diagnosis not present

## 2018-02-20 DIAGNOSIS — E7800 Pure hypercholesterolemia, unspecified: Secondary | ICD-10-CM | POA: Diagnosis not present

## 2018-02-20 DIAGNOSIS — F3289 Other specified depressive episodes: Secondary | ICD-10-CM | POA: Diagnosis not present

## 2018-02-20 DIAGNOSIS — Z9189 Other specified personal risk factors, not elsewhere classified: Secondary | ICD-10-CM | POA: Diagnosis not present

## 2018-02-20 DIAGNOSIS — Z6841 Body Mass Index (BMI) 40.0 and over, adult: Secondary | ICD-10-CM

## 2018-02-20 DIAGNOSIS — E66813 Obesity, class 3: Secondary | ICD-10-CM

## 2018-02-20 DIAGNOSIS — E88819 Insulin resistance, unspecified: Secondary | ICD-10-CM

## 2018-02-20 MED ORDER — BUPROPION HCL ER (SR) 150 MG PO TB12: 150.0000 mg | ORAL_TABLET | Freq: Every day | ORAL | 0 refills | Status: DC

## 2018-02-20 MED ORDER — VITAMIN D (ERGOCALCIFEROL) 1.25 MG (50000 UNIT) PO CAPS: 50000.0000 [IU] | ORAL_CAPSULE | ORAL | 0 refills | Status: DC

## 2018-02-20 MED ORDER — METFORMIN HCL ER 500 MG PO TB24: 500.0000 mg | ORAL_TABLET | Freq: Every day | ORAL | 0 refills | Status: AC

## 2018-02-20 NOTE — Progress Notes (Signed)
Office: [phone]  /  Fax: [phone]   HPI:   Chief Complaint: OBESITY Michele Hansen is here to discuss her progress with her obesity treatment plan. She is on the Category 3 plan and is following her eating plan approximately 75 to 80 % of the time. She states she is exercising 0 minutes 0 times per week. Michele Hansen did very well with weight loss on her Category 3 plan. She struggled with some meal planning, especially with her school schedule, but she did well with making strategies to stay on track.  Her weight is 295 lb (133.8 kg) today and has had a weight loss of 4 pounds over a period of 2 weeks since her last visit. She has lost 4 lbs since starting treatment with [name].  Hyperlipidemia (Pure) Michele Hansen has hyperlipidemia and isolated elevated LDL, which is mildly increased. She would like to diet control to improve her cholesterol levels with intensive lifestyle modification including a low saturated fat diet, exercise and weight loss. She denies any chest pain.  Vitamin D deficiency Michele Hansen has a new diagnosis of vitamin D deficiency. She is not currently taking vit D and admits fatigue.  Insulin Resistance Michele Hansen has a diagnosis of insulin resistance based on her elevated fasting insulin level >5. She has a history of polycystic ovarian syndrome. Although Michele Hansen's blood glucose readings are still under good control, insulin resistance puts her at greater risk of metabolic syndrome and diabetes. She is taking metformin, but only 1 tablet per day due to GI upset. She continues to work on diet and exercise to decrease risk of diabetes.  At risk for diabetes Michele Hansen is at higher than average risk for developing diabetes due to her pre-diabetes and obesity. She currently denies polyuria or polydipsia.  Depression with emotional eating behaviors Michele Hansen notes increased stress and emotional eating and using food for comfort to the extent that it is negatively impacting her health. She states that it  is worse with the stress of studying. She often snacks when she is not hungry. Michele Hansen sometimes feels she is out of control and then feels guilty that she made poor food choices. She has been working on behavior modification techniques to help reduce her emotional eating and has been somewhat successful. She shows no sign of suicidal or homicidal ideations.  ASSESSMENT AND PLAN:  Pure hypercholesterolemia  Vitamin D deficiency - Plan: Vitamin D, Ergocalciferol, (DRISDOL) 1.25 MG (50000 UT) CAPS capsule  Insulin resistance - Plan: metFORMIN (GLUCOPHAGE-XR) 500 MG 24 hr tablet  Other depression - with emotional eating - Plan: buPROPion (WELLBUTRIN SR) 150 MG 12 hr tablet  At risk for diabetes mellitus  Class 3 severe obesity with serious comorbidity and body mass index (BMI) of 45.0 to 49.9 in adult, unspecified obesity type (HCC)  PLAN:  Hyperlipidemia (Pure) Michele Hansen was informed of the American Heart Association Guidelines emphasizing intensive lifestyle modifications as the first line treatment for hyperlipidemia. We discussed many lifestyle modifications today in depth, and Michele Hansen will continue to work on decreasing saturated fats such as fatty red meat, butter and many fried foods. She will also increase vegetables and lean protein in her diet and continue to work on exercise and weight loss efforts. We will recheck labs in 3 months. Arsenia agrees to continue her diet and exercise and she will follow up in 2 weeks.  Vitamin D Deficiency Michele Hansen was informed that low vitamin D levels contributes to fatigue and are associated with obesity, breast, and colon cancer. Michele Hansen agrees to  start to take prescription Vit D @50 ,000 IU every week #4 with no refills and will follow up for routine testing of vitamin D, at least 2-3 times per year. She was informed of the risk of over-replacement of vitamin D and agrees to not increase her dose unless she discusses this with us first. Michele Hansen agrees to follow  up in 2 weeks as directed. We will recheck labs in 3 months.  Insulin Resistance Michele Hansen will continue to work on weight loss, exercise, and decreasing simple carbohydrates in her diet to help decrease the risk of diabetes. We discussed metformin including benefits and risks. She was informed that eating too many simple carbohydrates or too many calories at one sitting increases the likelihood of GI side effects. Michele Hansen agreed to change to metformin XR 500mg  qAM #30 with no refills and prescription was written today. Michele Hansen agreed to follow up with us as directed to monitor her progress.  Diabetes risk counseling Michele Hansen was given extended (30 minutes) diabetes prevention counseling today. She is 43 y.o. female and has risk factors for diabetes including insulin resistance and obesity. We discussed intensive lifestyle modifications today with an emphasis on weight loss as well as increasing exercise and decreasing simple carbohydrates in her diet.  Depression with Emotional Eating Behaviors We discussed behavior modification techniques today to help Michele Hansen deal with her emotional eating and depression. She has agreed to start Wellbutrin SR 150 mg qAM #30 with no refills and agreed to follow up as directed.  Obesity Michele Hansen is currently in the action stage of change. As such, her goal is to continue with weight loss efforts. She has agreed to follow the Category 3 plan. Michele Hansen has been instructed to work up to a goal of 150 minutes of combined cardio and strengthening exercise per week for weight loss and overall health benefits. We discussed the following Behavioral Modification Strategies today: increasing lean protein intake, decreasing simple carbohydrates, work on meal planning, and easy cooking plans and emotional eating strategies.  Michele Hansen has agreed to follow up with our clinic in 2 weeks. She was informed of the importance of frequent follow up visits to maximize her success with intensive  lifestyle modifications for her multiple health conditions.  ALLERGIES: Allergies  Allergen Reactions  . Banana Anaphylaxis and Nausea And Vomiting  . Latex Itching and Rash  . Avocado   . Citrus   . Date Seed Extract [Zizyphus Jujuba]   . Tape     MEDICATIONS: Current Outpatient Medications on File Prior to Visit  Medication Sig Dispense Refill  . acetaminophen (TYLENOL) 500 MG tablet Take 500 mg by mouth every 6 (six) hours as needed.    . calcium carbonate (TUMS - DOSED IN MG ELEMENTAL CALCIUM) 500 MG chewable tablet Chew 1 tablet by mouth daily.    . cholecalciferol (VITAMIN D3) 25 MCG (1000 UT) tablet Take 1,000 Units by mouth daily.    [name]. ibuprofen (ADVIL,MOTRIN) 800 MG tablet Take 800 mg by mouth every 8 (eight) hours as needed.    . naproxen (NAPROSYN) 500 MG tablet Reported on 06/25/2015  0  . vitamin E 400 UNIT capsule Take 400 Units by mouth daily.     No current facility-administered medications on file prior to visit.     PAST MEDICAL HISTORY: Past Medical History:  Diagnosis Date  . Abnormal uterine bleeding (AUB)   . ADD (attention deficit disorder)   . Allergies   . AR (allergic rhinitis)   . Asthma   .  Constipation   . Depression   . Endometriosis 2014  . Family history of breast cancer   . Fatigue   . Heavy periods   . Infertility, female   . Irregular periods   . Joint pain   . Knee MCL sprain   . Lactose intolerance   . Multiple food allergies    Bananas, Dates, Avocado, Citrus  . Painful menstrual periods   . Polycystic ovarian disease 2010  . Stress   . Vitamin D deficiency   . Yeast infection     PAST SURGICAL HISTORY: Past Surgical History:  Procedure Laterality Date  . BREAST CYST EXCISION Right 2015   [facility] Removed cyst.   . BREAST SURGERY Bilateral 2004   breast reduction  . cryotherapy    . CYSTECTOMY Right 2015   breast  . DILATION AND CURETTAGE OF UTERUS    . HYSTEROSCOPY W/D&C N/A 05/13/2014   Procedure: DILATATION AND  CURETTAGE /HYSTEROSCOPY;  Surgeon: Herold HarmsMartin A Defrancesco, MD;  Location: [facility] ORS;  Service: Gynecology;  Laterality: N/A;  . MASS EXCISION N/A 2010   from uterus  . REDUCTION MAMMAPLASTY Bilateral 2004   Reduction     SOCIAL HISTORY: Social History   Tobacco Use  . Smoking status: Never Smoker  . Smokeless tobacco: Never Used  Substance Use Topics  . Alcohol use: Yes    Comment: occas  . Drug use: No    FAMILY HISTORY: Family History  Problem Relation Age of Onset  . Diabetes Father   . Colon cancer Father   . Obesity Father   . Breast cancer Mother   . Hyperlipidemia Mother   . Depression Maternal Grandfather   . Breast cancer Paternal Grandmother   . Heart disease Neg Hx   . Ovarian cancer Neg Hx     ROS: Review of Systems  Constitutional: Positive for malaise/fatigue and weight loss.  Cardiovascular: Negative for chest pain.  Genitourinary:       Negative for polyuria.  Endo/Heme/Allergies: Negative for polydipsia.  Psychiatric/Behavioral: Positive for depression. Negative for suicidal ideas.       Negative for homicidal ideations. Positive for stress.    PHYSICAL EXAM: Blood pressure 113/77, pulse 73, temperature 97.9 F (36.6 C), temperature source Oral, height 5\' 7"  (1.702 m), weight 295 lb (133.8 kg), last menstrual period 02/08/2018, SpO2 99 %. Body mass index is 46.2 kg/m. Physical Exam Vitals signs reviewed.  Constitutional:      Appearance: Normal appearance. She is obese.  Cardiovascular:     Rate and Rhythm: Normal rate.  Pulmonary:     Effort: Pulmonary effort is normal.  Musculoskeletal: Normal range of motion.  Skin:    General: Skin is warm and dry.  Neurological:     Mental Status: She is alert and oriented to person, place, and time.  Psychiatric:        Mood and Affect: Mood normal.        Behavior: Behavior normal.     RECENT LABS AND TESTS: BMET    Component Value Date/Time   NA 137 02/06/2018 1033   NA 139 01/20/2011  0606   K 4.6 02/06/2018 1033   K 4.1 01/20/2011 0606   CL 104 02/06/2018 1033   CL 108 (H) 01/20/2011 0606   CO2 19 (L) 02/06/2018 1033   CO2 23 01/20/2011 0606   GLUCOSE 74 02/06/2018 1033   GLUCOSE 73 08/20/2014 1036   GLUCOSE 84 01/20/2011 0606   BUN 10 02/06/2018 1033  BUN 10 01/20/2011 0606   CREATININE 0.85 02/06/2018 1033   CREATININE 0.83 01/20/2011 0606   CALCIUM 9.2 02/06/2018 1033   CALCIUM 8.9 01/20/2011 0606   GFRNONAA 86 02/06/2018 1033   GFRNONAA >60 01/20/2011 0606   GFRAA 99 02/06/2018 1033   GFRAA >60 01/20/2011 0606   Lab Results  Component Value Date   HGBA1C 4.9 02/06/2018   HGBA1C 4.9 11/07/2015   Lab Results  Component Value Date   INSULIN 40.6 (H) 02/06/2018   CBC    Component Value Date/Time   WBC 5.2 02/06/2018 1033   WBC 8.5 08/20/2014 1036   RBC 4.41 02/06/2018 1033   RBC 4.00 08/20/2014 1036   HGB 12.6 02/06/2018 1033   HCT 39.6 02/06/2018 1033   PLT 282 11/07/2015 0900   MCV 90 02/06/2018 1033   MCV 90 01/20/2011 0606   MCH 28.6 02/06/2018 1033   MCH 27.5 08/20/2014 1036   MCHC 31.8 02/06/2018 1033   MCHC 31.8 (L) 08/20/2014 1036   RDW 13.6 02/06/2018 1033   RDW 13.5 01/20/2011 0606   LYMPHSABS 2.1 02/06/2018 1033   LYMPHSABS 1.7 01/20/2011 0606   MONOABS 0.6 08/20/2014 1036   MONOABS 0.4 01/20/2011 0606   EOSABS 0.1 02/06/2018 1033   EOSABS 0.1 01/20/2011 0606   BASOSABS 0.0 02/06/2018 1033   BASOSABS 0.0 01/20/2011 0606   Iron/TIBC/Ferritin/ %Sat    Component Value Date/Time   IRON 100 11/07/2015 0900   Lipid Panel     Component Value Date/Time   CHOL 173 02/06/2018 1033   TRIG 79 02/06/2018 1033   HDL 52 02/06/2018 1033   CHOLHDL 3.2 05/04/2017 0812   LDLCALC 105 (H) 02/06/2018 1033   Hepatic Function Panel     Component Value Date/Time   PROT 7.5 02/06/2018 1033   PROT 7.7 01/20/2011 0606   ALBUMIN 4.5 02/06/2018 1033   ALBUMIN 3.8 01/20/2011 0606   AST 14 02/06/2018 1033   AST 15 01/20/2011 0606   ALT  18 02/06/2018 1033   ALT 27 01/20/2011 0606   ALKPHOS 56 02/06/2018 1033   ALKPHOS 55 01/20/2011 0606   BILITOT 0.5 02/06/2018 1033   BILITOT 0.4 01/20/2011 0606      Component Value Date/Time   TSH 2.370 02/06/2018 1033   TSH 2.380 11/07/2015 0900   TSH 1.690 06/25/2015 1100   Results for [place], BETTIE SALTZ (MRN [id number]) as of 02/20/2018 11:35  Ref. Range 02/06/2018 10:33  Vitamin D, 25-Hydroxy Latest Ref Range: 30.0 - 100.0 ng/mL 19.5 (L)     OBESITY BEHAVIORAL INTERVENTION VISIT  Today's visit was # 2   Starting weight: 299 lbs Starting date: 02/06/18 Today's weight : Weight: 295 lb (133.8 kg)  Today's date: 02/20/2018 Total lbs lost to date: 4    Most Recent Value 02/24/2013 - 02/23/2018  Height 5\' 7"  (1.702 m) 02/20/2018  Weight 295 lb (133.8 kg) 02/20/2018  BMI (Calculated) 46.19 02/20/2018  BLOOD PRESSURE - SYSTOLIC 113 02/20/2018  BLOOD PRESSURE - DIASTOLIC 77 02/20/2018  Waist Measurement  52 inches 02/06/2018   Body Fat % 48.9 % 02/20/2018  Total Body Water (lbs) 100.2 lbs 02/20/2018  RMR 2304 02/06/2018   ASK: We discussed the diagnosis of obesity with [name] today and Sarin agreed to give [name] permission to discuss obesity behavioral modification therapy today.  ASSESS: Marcus has the diagnosis of obesity and her BMI today is 46.1. Nyashia is in the action stage of change.   ADVISE: Annsley was educated on the  multiple health risks of obesity as well as the benefit of weight loss to improve her health. She was advised of the need for long term treatment and the importance of lifestyle modifications to improve her current health and to decrease her risk of future health problems.  AGREE: Multiple dietary modification options and treatment options were discussed and Pantera agreed to follow the recommendations documented in the above note.  ARRANGE: Kaelyn was educated on the importance of frequent visits to treat obesity as outlined per CMS and USPSTF  guidelines and agreed to schedule her next follow up appointment today.  IKirke Corin, CMA, am acting as transcriptionist for Wilder Glade, MD  I have reviewed the above documentation for accuracy and completeness, and I agree with the above. -Quillian Quince, MD

## 2018-02-22 ENCOUNTER — Ambulatory Visit (INDEPENDENT_AMBULATORY_CARE_PROVIDER_SITE_OTHER): Payer: 59 | Admitting: Psychology

## 2018-02-22 DIAGNOSIS — F418 Other specified anxiety disorders: Secondary | ICD-10-CM

## 2018-02-22 NOTE — Progress Notes (Signed)
Office: [phone]  /  Fax: [phone]    Date: February 22, 2018  Time Seen: 2:00pm Duration: 38 minutes Provider: Lawerance Cruel, PsyD Type of Session: Intake for Individual Therapy  Type of Contact: Face-to-face  Informed Consent:The provider's role was explained to Michele Hansen. The provider reviewed and discussed issues of confidentiality, privacy, and limits therein. In addition to verbal informed consent, written informed consent for psychological services was obtained from Michele Hansen prior to the initial intake interview. Written consent included information concerning the practice, financial arrangements, and confidentiality and patients' rights. Since the clinic is not a 24/7 crisis [facility], mental health emergency resources were shared in the form of a handout, and the provider explained MyChart, e-mail, voicemail, and/or other messaging systems should be utilized only for non-emergency reasons. Michele Hansen verbally acknowledged understanding of the aforementioned, and agreed to use mental health emergency resources discussed if needed. Moreover, Michele Hansen agreed information may be shared with other [facility] providers as needed for coordination of care, and written consent was obtained.   Chief Complaint: Michele Hansen was referred by Dr. Quillian Quince due to depression with emotional eating behaviors. Per the note for the visit with Dr. Quillian Quince on February 20, 2018, "Michele Hansen has a positive PHQ-9 and admits to comfort eating and feeling overwhelmed at this. She is struggling with emotional eating and using food for comfort to the extent that it is negatively impacting her health. She often snacks when she is not hungry. Michele Hansen sometimes feels she is out of control and then feels guilty that she made poor food choices. She has been working on behavior modification techniques to help reduce her emotional eating and has been somewhat successful. 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 She acknowledged "stress eating" secondary to "everything on [her] plate." She shared her last episode of emotional eating was approximately one week ago while studying. She explained she consumed popcorn, which was portion controlled; however, she noted it was mindless eating. Michele Hansen shared she tends to crave chocolate and salt. Notably, she shared she was not eating enough resulting in weight gain.   Michele Hansen was asked to complete a questionnaire assessing various behaviors related to emotional eating. Michele Hansen endorsed the following: experience food cravings on a regular basis, eat certain foods when you are anxious, stressed, depressed, or your feelings are hurt, use food to help you cope with emotional situations, overeat when you are angry or upset, overeat when you are worried about something and eat as a reward.  HPI: Per the note for the initial visit with Dr. Quillian Quince on February 20, 2018, [facility] reported experiencing the following: significant food cravings issues , snacking frequently in the evenings, frequently drinking liquids with calories, frequently making poor food choices, binge eating behaviors, struggling with emotional eating, skipping meals frequently, waking up frquently in the middle of the night to eat and having problems with excessive hunger. During today's appointment, Michele Hansen shared emotional eating started in 2018 when she started her NP program. She denied a history of binge eating and overeating. Michele Hansen also denied a history of purging and engagement in other compensatory strategies and has never been diagnosed with an eating disorder.  Mental Status Examination: Michele Hansen arrived on time for the appointment. She presented as appropriately dressed and groomed. Michele Hansen appeared her stated age  and  demonstrated adequate orientation to time, place, person, and purpose of the appointment. She also demonstrated appropriate eye contact. No psychomotor abnormalities or behavioral peculiarities noted. Her mood was euthymic with congruent affect. Her thought processes were logical, linear, and goal-directed. No hallucinations, delusions, bizarre thinking or behavior reported or observed. Judgment, insight, and impulse control appeared to be grossly intact. There was no evidence of paraphasias (i.e., errors in speech, gross mispronunciations, and word substitutions), repetition deficits, or disturbances in volume or prosody (i.e., rhythm and intonation). There was no evidence of attention or memory impairments. Michele Hansen denied current suicidal and homicidal ideation, plan, and intent.   Family & Psychosocial History: Michele Hansen reported she has been married for six years and she does not have any children. 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 She noted her highest level of eduction is a Manufacturing engineer in [employer], and she is currently enrolled in a nurse practitioner program. She reported her social support system consists of her husband, mom, dad, siblings, and friends. She shared she identifies with Christianity, and she noted she attends church two to three times a week.   Medical History:  Past Medical History:  Diagnosis Date  . Abnormal uterine bleeding (AUB)   . ADD (attention deficit disorder)   . Allergies   . AR (allergic rhinitis)   . Asthma   . Constipation   . Depression   . Endometriosis 2014  . Family history of breast cancer   . Fatigue   . Heavy periods   . Infertility, female   . Irregular periods   . Joint pain   . Knee MCL sprain   . Lactose intolerance   . Multiple food allergies    Bananas, Dates, Avocado, Citrus  . Painful menstrual periods   . Polycystic ovarian disease 2010  . Stress   . Vitamin D deficiency   . Yeast infection     Past Surgical History:  Procedure Laterality Date  . BREAST CYST EXCISION Right 2015   [facility] Removed cyst.   . BREAST SURGERY Bilateral 2004   breast reduction  . cryotherapy    . CYSTECTOMY Right 2015   breast  . DILATION AND CURETTAGE OF UTERUS    . HYSTEROSCOPY W/D&C N/A 05/13/2014   Procedure: DILATATION AND CURETTAGE /HYSTEROSCOPY;  Surgeon: Herold Harms, MD;  Location: [facility] ORS;  Service: Gynecology;  Laterality: N/A;  . MASS EXCISION N/A 2010   from uterus  . REDUCTION MAMMAPLASTY Bilateral 2004   Reduction    Current Outpatient Medications on File Prior to Visit  Medication Sig Dispense Refill  . acetaminophen (TYLENOL) 500 MG tablet Take 500 mg by mouth every 6 (six) hours as needed.    [name] buPROPion (WELLBUTRIN SR) 150 MG 12 hr tablet Take 1 tablet (150 mg total) by mouth daily. 30 tablet 0  . calcium carbonate (TUMS - DOSED IN MG ELEMENTAL CALCIUM) 500 MG chewable tablet Chew 1 tablet by mouth daily.    . cholecalciferol (VITAMIN D3) 25 MCG (1000 UT) tablet Take 1,000 Units by mouth daily.    [name] ibuprofen (ADVIL,MOTRIN) 800 MG tablet Take 800 mg by mouth every 8 (eight) hours as needed.    . metFORMIN (GLUCOPHAGE-XR) 500 MG 24 hr tablet Take 1 tablet (500 mg total) by mouth daily with breakfast. 30 tablet 0  . naproxen (NAPROSYN) 500 MG tablet Reported on 06/25/2015  0  . Vitamin D, Ergocalciferol, (DRISDOL) 1.25 MG (50000 UT) CAPS  capsule Take 1 capsule (50,000 Units total) by mouth every 7 (seven) days. 4 capsule 0  . vitamin E 400 UNIT capsule Take 400 Units by mouth daily.     No current facility-administered medications on file prior to visit.   Jahmiya denied a history of head injuries and loss of consciousness.   Mental Health History: Michele Hansen reported she had a counselor through work and she attended two sessions due to work related stressors, which was at the end of last year. She denied ever meeting with a psychiatrist, and has never been hospitalized for  psychiatric concerns. She shared Michele Hansen prescribed Wellbutrin on Monday. Additionally, she "self-diagnosed" herself with "ADD." Regarding family history of mental health related concerns, Michele Hansen shared her brother was diagnosed with ADD as an adult. Michele Hansen denied a trauma history, including psychological, physical  and sexual abuse, as well as neglect.   Michele Hansen shared her mood is typically "pretty chill, easygoing." She indicated she sleeps too much, but explained that it is interrupted sleep as she will sometimes wake up to do work. She endorsed experiencing attention and concentration issues mostly when studying. She explained she often finds other things to do when trying to study. She also endorsed experiencing difficulty remembering appointments. Michele Hansen indicated she experiences worry regarding school, work, and her mother's health. She denied use of illicit and recreational substances, but noted consuming alcohol "socially." [facility] also endorsed experiencing fatigue and becoming easily annoyed.   Michele Hansen denied experiencing the following: anhedonia, depressed mood, hopelessness, appetite issues, decreased self-esteem, feeling fidgety/restless, obsessions and compulsions, hallucinations and delusions, mania, angry outbursts and social withdrawal. She also denied history of and current suicidal ideation, plan, and intent; history of and current homicidal ideation, plan, and intent; and history of and current engagement in self-harm.  The following strengths were reported by Michele Hansen: leader, people person, outgoing, and outspoken. The following strengths were observed by this provider: ability to express thoughts and feelings during the therapeutic session, ability to establish and benefit from a therapeutic relationship, ability to learn and practice coping skills, willingness to work toward established goal(s) with the clinic and ability to engage in reciprocal conversation.  Legal History:  Sam  denied a history of legal involvement.   Structured Assessment Results: The Patient Health Questionnaire-9 (PHQ-9) is a self-report measure that assesses symptoms and severity of depression over the course of the last two weeks. Anaka obtained a score of 3 suggesting minimal depression. Emaley finds the endorsed symptoms to be not difficult at all. Depression screen [facility] 2/9 02/22/2018  Decreased Interest 0  Down, Depressed, Hopeless 0  PHQ - 2 Score 0  Altered sleeping 1  Tired, decreased energy 1  Change in appetite 0  Feeling bad or failure about yourself  0  Trouble concentrating 1  Moving slowly or fidgety/restless 0  Suicidal thoughts 0  PHQ-9 Score 3  Difficult doing work/chores -   The Generalized Anxiety Disorder-7 (GAD-7) is a brief self-report measure that assesses symptoms of anxiety over the course of the last two weeks. Sonakshi obtained a score of 8 suggesting mild anxiety. GAD 7 : Generalized Anxiety Score 02/22/2018  Nervous, Anxious, on Edge 2  Control/stop worrying 0  Worry too much - different things 2  Trouble relaxing 2  Restless 0  Easily annoyed or irritable 2  Afraid - awful might happen 0  Total GAD 7 Score 8  Anxiety Difficulty Not difficult at all   Interventions: A chart review was conducted prior to the  clinical intake interview. The PHQ-9, and GAD-7 were administered and a clinical intake interview was completed. In addition, Koraline was asked to complete a Mood and Food questionnaire to assess various behaviors related to emotional eating. Throughout session, empathic reflections and validation was provided. Continuing treatment with this provider was discussed and a treatment goal was established. Psychoeducation regarding emotional versus physical hunger was provided. Tresa was given a handout to utilize between now and the next appointment to increase awareness of hunger patterns and subsequent eating.   Provisional DSM-5 Diagnosis: 300.09 (F41.8) Other  Specified Anxiety Disorder, Emotional Eating Behaviors  Plan: Rozanna appears able and willing to participate as evidenced by collaboration on a treatment goal, engagement in reciprocal conversation, and asking questions as needed for clarification. Due to Joie's work schedule, the next appointment will be scheduled in two to three weeks. The following treatment goal was established: decrease emotional eating. For the aforementioned goal, Zoiee can benefit from biweekly individual therapy sessions that are brief in duration for approximately four to six sessions. The treatment modality will be individual therapeutic services, including an eclectic therapeutic approach utilizing techniques from Cognitive Behavioral Therapy, Patient Centered Therapy, Dialectical Behavior Therapy, Acceptance and Commitment Therapy, Interpersonal Therapy, and Cognitive Restructuring. Therapeutic approach will include various interventions as appropriate, such as validation, support, mindfulness, thought defusion, reframing, psychoeducation, values assessment, and role playing. This provider will regularly review the treatment plan and medical chart to keep informed of status changes. Nemiah expressed understanding and agreement with the initial treatment plan of care.

## 2018-03-09 NOTE — Progress Notes (Addendum)
Office: [phone]  /  Fax: [phone]    Date: 03/13/2018   Time Seen: 7:48am Duration: 25 minutes Provider: Lawerance Cruel, Psy.D. Type of Session: Individual Therapy  Type of Contact: Face-to-face  Session Content: Michele Hansen is a 43 y.o. female presenting for a follow-up appointment to address the previously established treatment goal of decreasing emotional eating. The session was initiated with the administration of the PHQ-9 and GAD-7, as well as a brief check-in. Michele Hansen shared she is "busy" with work and Research officer, trade union. She discussed having to eat out due to her current work schedule and circumstances (e.g., not having access to a refrigerator at work); however, she described making better choices. She denied any episodes of emotional eating since the last appointment with this provider. Regarding emotional hunger, Michele Hansen described experiencing emotional hunger at home versus at work due to having "access" to food, specifically when studying. Nevertheless, she discussed planning ahead for study breaks by using snack calories. Psychoeducation regarding triggers for emotional eating was provided. Michele Hansen was provided a handout, and encouraged to utilize the handout to increase awareness of triggers and frequency. Michele Hansen agreed.This provider also discussed behavioral strategies for specific triggers, such as placing the utensil down when conversing to avoid mindless eating. At this time, Michele Hansen indicated she does not feel she needs therapeutic services, as she is coping well. She discussed having increased awareness, and explained through previous therapeutic services she was able to "get the ball rolling." Overall, Michele Hansen was receptive to today's session as evidenced by openness to sharing, responsiveness to feedback, and willingness to discuss her triggers for emotional eating.  Mental Status Examination: Michele Hansen arrived early for the appointment. She presented as appropriately dressed and groomed.  Michele Hansen appeared her stated age and demonstrated adequate orientation to time, place, person, and purpose of the appointment. She also demonstrated appropriate eye contact. No psychomotor abnormalities or behavioral peculiarities noted. Her mood was euthymic with congruent affect. Her thought processes were logical, linear, and goal-directed. No hallucinations, delusions, bizarre thinking or behavior reported or observed. Judgment, insight, and impulse control appeared to be grossly intact. There was no evidence of paraphasias (i.e., errors in speech, gross mispronunciations, and word substitutions), repetition deficits, or disturbances in volume or prosody (i.e., rhythm and intonation). There was no evidence of attention or memory impairments. Michele Hansen denied current suicidal and homicidal ideation, plan and intent.   Structured Assessment Results: The Patient Health Questionnaire-9 (PHQ-9) is a self-report measure that assesses symptoms and severity of depression over the course of the last two weeks. Michele Hansen obtained a score of 1 suggesting minimal depression. Michele Hansen finds the endorsed symptoms to be not difficult at all. Depression screen [facility] 2/9 03/13/2018  Decreased Interest 0  Down, Depressed, Hopeless 0  PHQ - 2 Score 0  Altered sleeping 0  Tired, decreased energy 1  Change in appetite 0  Feeling bad or failure about yourself  0  Trouble concentrating 0  Moving slowly or fidgety/restless 0  Suicidal thoughts 0  PHQ-9 Score 1  Difficult doing work/chores -   The Generalized Anxiety Disorder-7 (GAD-7) is a brief self-report measure that assesses symptoms of anxiety over the course of the last two weeks. Michele Hansen obtained a score of 1 suggesting minimal anxiety. GAD 7 : Generalized Anxiety Score 03/13/2018  Nervous, Anxious, on Edge 0  Control/stop worrying 0  Worry too much - different things 1  Trouble relaxing 0  Restless 0  Easily annoyed or irritable 0  Afraid - awful might happen 0  Total  GAD 7 Score 1  Anxiety Difficulty Not difficult at all   Interventions:  Administration of PHQ-9 and GAD-7 for symptom monitoring Review of content from the previous session Empathic reflections and validation Psychoeducation regarding triggers for emotional eating Rapport building Brief chart review  DSM-5 Diagnosis: 300.09 (F41.8) Other Specified Anxiety Disorder, Emotional Eating Behaviors  Treatment Goal & Progress: During the initial appointment with this provider, the following treatment goal was established: decrease emotional eating. Michele Hansen has demonstrated progress in her goal as evidenced by increased awareness of hunger patterns and triggers for emotional eating. She also discussed engaging in meal prep, and implementing stress management strategies.  Plan: Michele Hansen declined future appointments with this provider. She described having increased awareness and ability to cope with emotional eating.  This provider explained that should she wish to re-initiate services, she may call this provider's clinic and request an appointment. Michele Hansen acknowledged understanding, and added, "I have no problem asking for help if I need it."

## 2018-03-13 ENCOUNTER — Ambulatory Visit (INDEPENDENT_AMBULATORY_CARE_PROVIDER_SITE_OTHER): Payer: 59 | Admitting: Psychology

## 2018-03-13 DIAGNOSIS — F418 Other specified anxiety disorders: Secondary | ICD-10-CM

## 2018-03-15 ENCOUNTER — Encounter (INDEPENDENT_AMBULATORY_CARE_PROVIDER_SITE_OTHER): Payer: Self-pay | Admitting: Family Medicine

## 2018-03-15 ENCOUNTER — Ambulatory Visit (INDEPENDENT_AMBULATORY_CARE_PROVIDER_SITE_OTHER): Payer: Managed Care, Other (non HMO) | Admitting: Family Medicine

## 2018-03-15 ENCOUNTER — Other Ambulatory Visit: Payer: Self-pay

## 2018-03-15 VITALS — BP 121/80 | HR 92 | Temp 97.7°F | Ht 67.0 in | Wt 295.0 lb

## 2018-03-15 DIAGNOSIS — Z6841 Body Mass Index (BMI) 40.0 and over, adult: Secondary | ICD-10-CM

## 2018-03-15 DIAGNOSIS — F3289 Other specified depressive episodes: Secondary | ICD-10-CM | POA: Diagnosis not present

## 2018-03-15 DIAGNOSIS — Z9189 Other specified personal risk factors, not elsewhere classified: Secondary | ICD-10-CM

## 2018-03-15 DIAGNOSIS — E8881 Metabolic syndrome: Secondary | ICD-10-CM | POA: Diagnosis not present

## 2018-03-15 MED ORDER — BUPROPION HCL ER (SR) 150 MG PO TB12: 150.0000 mg | ORAL_TABLET | Freq: Every day | ORAL | 0 refills | Status: DC

## 2018-03-15 NOTE — Progress Notes (Signed)
Office: [phone]  /  Fax: [phone]   HPI:   Chief Complaint: OBESITY Michele Hansen is here to discuss her progress with her obesity treatment plan. She is on the Category 3 plan and is following her eating plan approximately 85-90 % of the time. She states she is exercising 0 minutes 0 times per week. [name] has been in training so she hasn't been able to bring lunch with her secondary to no ability to refrigerate food. She has been trying to pick healthier options at lunch. She has been monitoring calories on apps.[name]  Her weight is 295 lb (133.8 kg) today and has not lost weight since her last visit. She has lost 4 lbs since starting treatment with [name].  Insulin Resistance [name] has a diagnosis of insulin resistance based on her elevated fasting insulin level >5. Although Michele Hansen's blood glucose readings are still under good control, insulin resistance puts her at greater risk of metabolic syndrome and diabetes. She she denies GI side effects of metformin and notes minimal carbohydrate cravings. She continues to work on diet and exercise to decrease risk of diabetes.  At risk for diabetes [name] is at higher than average risk for developing diabetes due to her obesity and insulin resistance. She currently denies polyuria or polydipsia.  Depression with emotional eating behaviors [name] notes improvement with addition of Wellbutrin. [name] struggles with emotional eating and using food for comfort to the extent that it is negatively impacting her health. She often snacks when she is not hungry. [name] sometimes feels she is out of control and then feels guilty that she made poor food choices. She has been working on behavior modification techniques to help reduce her emotional eating and has been somewhat successful. She shows no sign of suicidal or homicidal ideations.  Depression screen [name] 2/9 03/13/2018 02/22/2018 02/06/2018  Decreased Interest 0 0 2  Down, Depressed, Hopeless 0 0 2  PHQ - 2  Score 0 0 4  Altered sleeping 0 1 3  Tired, decreased energy Change in appetite 0 0 3  Feeling bad or failure about yourself  0 0 3  Trouble concentrating 0 1 3  Moving slowly or fidgety/restless 0 0 0  Suicidal thoughts 0 0 0  PHQ-9 Score Difficult doing work/chores - - Somewhat difficult    ASSESSMENT AND PLAN:  Insulin resistance  Other depression - with emotional eating - Plan: buPROPion (WELLBUTRIN SR) 150 MG 12 hr tablet  At risk for diabetes mellitus  Class 3 severe obesity with serious comorbidity and body mass index (BMI) of 45.0 to 49.9 in adult, unspecified obesity type (HCC)  PLAN:  Insulin Resistance Michele Hansen will continue to work on weight loss, exercise, and decreasing simple carbohydrates in her diet to help decrease the risk of diabetes. We dicussed metformin including benefits and risks. She was informed that eating too many simple carbohydrates or too many calories at one sitting increases the likelihood of GI side effects. Delise agrees to continue taking metformin, no refill needed, and she agrees to follow up with our clinic in 4 weeks as directed to monitor her progress.  Diabetes risk counseling Michele Hansen was given extended (15 minutes) diabetes prevention counseling today. She is 43 y.o. female and has risk factors for diabetes including obesity and insulin resistance. We discussed intensive lifestyle modifications today with an emphasis on weight loss as well as increasing exercise and decreasing simple carbohydrates in her diet.  Depression with Emotional Eating  Behaviors We discussed behavior modification techniques today to help Michele Hansen deal with her emotional eating and depression. [name] agrees to continue taking Wellbutrin SR 150 mg PO q daily and we will refill for 1 month. Michele Hansen agrees to follow up with our clinic in 4 weeks.   Obesity Michele Hansen is currently in the action stage of change. As such, her goal is to continue with weight loss  efforts She has agreed to keep a food journal with 350-500 calories and 30+ grams of protein at lunch daily and follow the Category 3 plan Michele Hansen has been instructed to work up to a goal of 150 minutes of combined cardio and strengthening exercise per week for weight loss and overall health benefits. We discussed the following Behavioral Modification Strategies today: increasing lean protein intake, increasing vegetables, work on meal planning and easy cooking plans, better snacking choices, and planning for success   Michele Hansen has agreed to follow up with our clinic in 4 weeks. She was informed of the importance of frequent follow up visits to maximize her success with intensive lifestyle modifications for her multiple health conditions.  ALLERGIES: Allergies  Allergen Reactions  . Banana Anaphylaxis and Nausea And Vomiting  . Latex Itching and Rash  . Avocado   . Citrus   . Date Seed Extract [Zizyphus Jujuba]   . Tape     MEDICATIONS: Current Outpatient Medications on File Prior to Visit  Medication Sig Dispense Refill  . acetaminophen (TYLENOL) 500 MG tablet Take 500 mg by mouth every 6 (six) hours as needed.    . calcium carbonate (TUMS - DOSED IN MG ELEMENTAL CALCIUM) 500 MG chewable tablet Chew 1 tablet by mouth daily.    . cholecalciferol (VITAMIN D3) 25 MCG (1000 UT) tablet Take 1,000 Units by mouth daily.    [name] ibuprofen (ADVIL,MOTRIN) 800 MG tablet Take 800 mg by mouth every 8 (eight) hours as needed.    . metFORMIN (GLUCOPHAGE-XR) 500 MG 24 hr tablet Take 1 tablet (500 mg total) by mouth daily with breakfast. 30 tablet 0  . naproxen (NAPROSYN) 500 MG tablet Reported on 06/25/2015  0  . Vitamin D, Ergocalciferol, (DRISDOL) 1.25 MG (50000 UT) CAPS capsule Take 1 capsule (50,000 Units total) by mouth every 7 (seven) days. 4 capsule 0  . vitamin E 400 UNIT capsule Take 400 Units by mouth daily.     No current facility-administered medications on file prior to visit.     PAST  MEDICAL HISTORY: Past Medical History:  Diagnosis Date  . Abnormal uterine bleeding (AUB)   . ADD (attention deficit disorder)   . Allergies   . AR (allergic rhinitis)   . Asthma   . Constipation   . Depression   . Endometriosis 2014  . Family history of breast cancer   . Fatigue   . Heavy periods   . Infertility, female   . Irregular periods   . Joint pain   . Knee MCL sprain   . Lactose intolerance   . Multiple food allergies    Bananas, Dates, Avocado, Citrus  . Painful menstrual periods   . Polycystic ovarian disease 2010  . Stress   . Vitamin D deficiency   . Yeast infection     PAST SURGICAL HISTORY: Past Surgical History:  Procedure Laterality Date  . BREAST CYST EXCISION Right 2015   [facility] Removed cyst.   . BREAST SURGERY Bilateral 2004   breast reduction  . cryotherapy    . CYSTECTOMY Right 2015  breast  . DILATION AND CURETTAGE OF UTERUS    . HYSTEROSCOPY W/D&C N/A 05/13/2014   Procedure: DILATATION AND CURETTAGE /HYSTEROSCOPY;  Surgeon: Herold Harms, MD;  Location: [facility] ORS;  Service: Gynecology;  Laterality: N/A;  . MASS EXCISION N/A 2010   from uterus  . REDUCTION MAMMAPLASTY Bilateral 2004   Reduction     SOCIAL HISTORY: Social History   Tobacco Use  . Smoking status: Never Smoker  . Smokeless tobacco: Never Used  Substance Use Topics  . Alcohol use: Yes    Comment: occas  . Drug use: No    FAMILY HISTORY: Family History  Problem Relation Age of Onset  . Diabetes Father   . Colon cancer Father   . Obesity Father   . Breast cancer Mother   . Hyperlipidemia Mother   . Depression Maternal Grandfather   . Breast cancer Paternal Grandmother   . Heart disease Neg Hx   . Ovarian cancer Neg Hx     ROS: Review of Systems  Constitutional: Negative for weight loss.  Genitourinary: Negative for frequency.  Endo/Heme/Allergies: Negative for polydipsia.  Psychiatric/Behavioral: Positive for depression. Negative for suicidal ideas.     PHYSICAL EXAM: Blood pressure 121/80, pulse 92, temperature 97.7 F (36.5 C), height  (1.702 m), weight 295 lb (133.8 kg), last menstrual period 03/12/2018, SpO2 98 %. Body mass index is 46.2 kg/m. Physical Exam Vitals signs reviewed.  Constitutional:      Appearance: Normal appearance. She is obese.  Cardiovascular:     Rate and Rhythm: Normal rate.     Pulses: Normal pulses.  Pulmonary:     Effort: Pulmonary effort is normal.     Breath sounds: Normal breath sounds.  Musculoskeletal: Normal range of motion.  Skin:    General: Skin is warm and dry.  Neurological:     Mental Status: She is alert and oriented to person, place, and time.  Psychiatric:        Mood and Affect: Mood normal.        Behavior: Behavior normal.     RECENT LABS AND TESTS: BMET    Component Value Date/Time   NA 137 02/06/2018 1033   NA 139 01/20/2011 0606   K 4.6 02/06/2018 1033   K 4.1 01/20/2011 0606   CL 104 02/06/2018 1033   CL 108 (H) 01/20/2011 0606   CO2 19 (L) 02/06/2018 1033   CO2 23 01/20/2011 0606   GLUCOSE 74 02/06/2018 1033   GLUCOSE 73 08/20/2014 1036   GLUCOSE 84 01/20/2011 0606   BUN 10 02/06/2018 1033   BUN 10 01/20/2011 0606   CREATININE 0.85 02/06/2018 1033   CREATININE 0.83 01/20/2011 0606   CALCIUM 9.2 02/06/2018 1033   CALCIUM 8.9 01/20/2011 0606   GFRNONAA 86 02/06/2018 1033   GFRNONAA >60 01/20/2011 0606   GFRAA 99 02/06/2018 1033   GFRAA >60 01/20/2011 0606   Lab Results  Component Value Date   HGBA1C 4.9 02/06/2018   HGBA1C 4.9 11/07/2015   Lab Results  Component Value Date   INSULIN 40.6 (H) 02/06/2018   CBC    Component Value Date/Time   WBC 5.2 02/06/2018 1033   WBC 8.5 08/20/2014 1036   RBC 4.41 02/06/2018 1033   RBC 4.00 08/20/2014 1036   HGB 12.6 02/06/2018 1033   HCT 39.6 02/06/2018 1033   PLT 282 11/07/2015 0900   MCV 90 02/06/2018 1033   MCV 90 01/20/2011 0606   MCH 28.6 02/06/2018 1033  MCH 27.5 08/20/2014 1036   MCHC 31.8  02/06/2018 1033   MCHC 31.8 (L) 08/20/2014 1036   RDW 13.6 02/06/2018 1033   RDW 13.5 01/20/2011 0606   LYMPHSABS 2.1 02/06/2018 1033   LYMPHSABS 1.7 01/20/2011 0606   MONOABS 0.6 08/20/2014 1036   MONOABS 0.4 01/20/2011 0606   EOSABS 0.1 02/06/2018 1033   EOSABS 0.1 01/20/2011 0606   BASOSABS 0.0 02/06/2018 1033   BASOSABS 0.0 01/20/2011 0606   Iron/TIBC/Ferritin/ %Sat    Component Value Date/Time   IRON 100 11/07/2015 0900   Lipid Panel     Component Value Date/Time   CHOL 173 02/06/2018 1033   TRIG 79 02/06/2018 1033   HDL 52 02/06/2018 1033   CHOLHDL 3.2 05/04/2017 0812   LDLCALC 105 (H) 02/06/2018 1033   Hepatic Function Panel     Component Value Date/Time   PROT 7.5 02/06/2018 1033   PROT 7.7 01/20/2011 0606   ALBUMIN 4.5 02/06/2018 1033   ALBUMIN 3.8 01/20/2011 0606   AST 14 02/06/2018 1033   AST 15 01/20/2011 0606   ALT 18 02/06/2018 1033   ALT 27 01/20/2011 0606   ALKPHOS 56 02/06/2018 1033   ALKPHOS 55 01/20/2011 0606   BILITOT 0.5 02/06/2018 1033   BILITOT 0.4 01/20/2011 0606      Component Value Date/Time   TSH 2.370 02/06/2018 1033   TSH 2.380 11/07/2015 0900   TSH 1.690 06/25/2015 1100      OBESITY BEHAVIORAL INTERVENTION VISIT  Today's visit was # 3   Starting weight: 299 lbs Starting date: 02/06/2018 Today's weight : 295 lbs  Today's date: 03/15/2018 Total lbs lost to date: 4    03/15/2018  Height 5\' 7"  (1.702 m)  Weight 295 lb (133.8 kg)  BMI (Calculated) 46.19  BLOOD PRESSURE - SYSTOLIC 121  BLOOD PRESSURE - DIASTOLIC 80   Body Fat % 49.6 %  Total Body Water (lbs) 101.2 lbs     ASK: We discussed the diagnosis of obesity with [name] today and Ellason agreed to give [name] permission to discuss obesity behavioral modification therapy today.  ASSESS: Idelia has the diagnosis of obesity and her BMI today is 46.19 Kymberlie is in the action stage of change   ADVISE: Ahonesty was educated on the multiple health risks of obesity  as well as the benefit of weight loss to improve her health. She was advised of the need for long term treatment and the importance of lifestyle modifications to improve her current health and to decrease her risk of future health problems.  AGREE: Multiple dietary modification options and treatment options were discussed and  Abigaelle agreed to follow the recommendations documented in the above note.  ARRANGE: Neema was educated on the importance of frequent visits to treat obesity as outlined per CMS and USPSTF guidelines and agreed to schedule her next follow up appointment today.  I, Burt Knack, am acting as transcriptionist for Debbra Riding, MD  I have reviewed the above documentation for accuracy and completeness, and I agree with the above. - Debbra Riding, MD

## 2018-03-28 ENCOUNTER — Encounter (INDEPENDENT_AMBULATORY_CARE_PROVIDER_SITE_OTHER): Payer: Self-pay

## 2018-04-03 ENCOUNTER — Encounter (INDEPENDENT_AMBULATORY_CARE_PROVIDER_SITE_OTHER): Payer: Self-pay | Admitting: Family Medicine

## 2018-04-03 NOTE — Telephone Encounter (Signed)
Please address

## 2018-04-28 ENCOUNTER — Other Ambulatory Visit (INDEPENDENT_AMBULATORY_CARE_PROVIDER_SITE_OTHER): Payer: Self-pay | Admitting: Family Medicine

## 2018-04-28 DIAGNOSIS — F3289 Other specified depressive episodes: Secondary | ICD-10-CM

## 2018-05-01 ENCOUNTER — Encounter (INDEPENDENT_AMBULATORY_CARE_PROVIDER_SITE_OTHER): Payer: Self-pay

## 2018-05-14 ENCOUNTER — Other Ambulatory Visit (INDEPENDENT_AMBULATORY_CARE_PROVIDER_SITE_OTHER): Payer: Self-pay | Admitting: Family Medicine

## 2018-05-14 DIAGNOSIS — E8881 Metabolic syndrome: Secondary | ICD-10-CM

## 2018-05-14 DIAGNOSIS — F3289 Other specified depressive episodes: Secondary | ICD-10-CM

## 2018-05-14 DIAGNOSIS — E559 Vitamin D deficiency, unspecified: Secondary | ICD-10-CM

## 2018-05-15 ENCOUNTER — Encounter (INDEPENDENT_AMBULATORY_CARE_PROVIDER_SITE_OTHER): Payer: Self-pay | Admitting: Family Medicine

## 2018-05-15 ENCOUNTER — Ambulatory Visit (INDEPENDENT_AMBULATORY_CARE_PROVIDER_SITE_OTHER): Payer: PRIVATE HEALTH INSURANCE | Admitting: Family Medicine

## 2018-05-15 ENCOUNTER — Other Ambulatory Visit: Payer: Self-pay

## 2018-05-15 DIAGNOSIS — Z6841 Body Mass Index (BMI) 40.0 and over, adult: Secondary | ICD-10-CM

## 2018-05-15 DIAGNOSIS — F3289 Other specified depressive episodes: Secondary | ICD-10-CM | POA: Diagnosis not present

## 2018-05-15 DIAGNOSIS — E8881 Metabolic syndrome: Secondary | ICD-10-CM | POA: Diagnosis not present

## 2018-05-15 MED ORDER — BUPROPION HCL ER (SR) 150 MG PO TB12: 150.0000 mg | ORAL_TABLET | Freq: Every day | ORAL | 0 refills | Status: DC

## 2018-05-15 NOTE — Progress Notes (Signed)
Office: [phone]  /  Fax: [phone] TeleHealth Visit:  Michele Hansen has verbally consented to this TeleHealth visit today. The patient is located at home, the provider is located at the [facility] office. The participants in this visit include the listed provider and patient. The visit was conducted today via face time.  HPI:   Chief Complaint: OBESITY Michele Hansen is here to discuss her progress with her obesity treatment plan. She is on the keep a food journal with 350-500 calories and 30+ grams of protein at lunch daily and follow the Category 3 plan and is following her eating plan approximately 70 % of the time. She states she is exercising 0 minutes 0 times per week. Michele Hansen lost 5 lbs in the past few weeks. She reports the hardest thing has been finding food, 45 calorie bread, occasionally meat, and greek yogurt. She finds dinner to be most difficult. She has had such increase in demand at work, so she has been exhausted in the evening. Her most recent weight was of 290 lbs. We were unable to weigh the patient today for this TeleHealth visit. She feels as if she has lost 5 lbs since her last visit. She has lost 4-9 lbs since starting treatment with us.  Insulin Resistance Michele Hansen has a diagnosis of insulin resistance based on her elevated fasting insulin level >5. Last labs were in early February 2020. Although Michele Hansen's blood glucose readings are still under good control, insulin resistance puts her at greater risk of metabolic syndrome and diabetes. She is taking metformin currently and notes occasional carbohydrate cravings. She continues to work on diet and exercise to decrease risk of diabetes.  Depression with Emotional Eating Behaviors Michele Hansen notes her symptoms are better controlled on Wellbutrin. Michele Hansen struggles with emotional eating and using food for comfort to the extent that it is negatively impacting her health. She often snacks when she is not hungry. Michele Hansen  sometimes feels she is out of control and then feels guilty that she made poor food choices. She has been working on behavior modification techniques to help reduce her emotional eating and has been somewhat successful. She shows no sign of suicidal or homicidal ideations.  [facility] 03/13/2018 02/22/2018 02/06/2018  Decreased Interest 0 0 2  Down, Depressed, Hopeless 0 0 2  PHQ - 2 Score 0 0 4  Altered sleeping 0 1 3  Tired, decreased energy 1 1 3   Change in appetite 0 0 3  Feeling bad or failure about yourself  0 0 3  Trouble concentrating 0 1 3  Moving slowly or fidgety/restless 0 0 0  Suicidal thoughts 0 0 0  PHQ-9 Score 1 3 19   Difficult doing work/chores - - Somewhat difficult    ASSESSMENT AND PLAN:  Insulin resistance  Other depression - with emotional eating - Plan: buPROPion (WELLBUTRIN SR) 150 MG 12 hr tablet  Class 3 severe obesity with serious comorbidity and body mass index (BMI) of 45.0 to 49.9 in adult, unspecified obesity type (HCC)  PLAN:  Insulin Resistance Michele Hansen will continue to work on weight loss, exercise, and decreasing simple carbohydrates in her diet to help decrease the risk of diabetes. We dicussed metformin including benefits and risks. She was informed that eating too many simple carbohydrates or too many calories at one sitting increases the likelihood of GI side effects. Michele Hansen agrees to continue taking metformin, and she agrees to follow up with our clinic in 2 weeks as directed to  monitor her progress.  Depression with Emotional Eating Behaviors We discussed behavior modification techniques today to help Michele Hansen deal with her emotional eating and depression. Michele Hansen agrees to continue taking Wellbutrin SR 150 mg PO daily #30 and we will refill for 1 month. Michele Hansen agrees to follow up with our clinic in 2 weeks.  Obesity Michele Hansen is currently in the action stage of change. As such, her goal is to continue with weight loss efforts She has  agreed to keep a food journal with 450-600 calories and 40+ grams of protein at supper daily and follow the Category 3 plan Michele Hansen has been instructed to work up to a goal of 150 minutes of combined cardio and strengthening exercise per week for weight loss and overall health benefits. We discussed the following Behavioral Modification Strategies today: increasing lean protein intake, increasing vegetables and work on meal planning and easy cooking plans, increase H20 intake, no skipping meals, keeping healthy foods in the home, better snacking choices, and planning for success   Michele Hansen has agreed to follow up with our clinic in 2 weeks. She was informed of the importance of frequent follow up visits to maximize her success with intensive lifestyle modifications for her multiple health conditions.  ALLERGIES: Allergies  Allergen Reactions  . Banana Anaphylaxis and Nausea And Vomiting  . Latex Itching and Rash  . Avocado   . Citrus   . Date Seed Extract [Zizyphus Jujuba]   . Tape     MEDICATIONS: Current Outpatient Medications on File Prior to Visit  Medication Sig Dispense Refill  . acetaminophen (TYLENOL) 500 MG tablet Take 500 mg by mouth every 6 (six) hours as needed.    [name] buPROPion (WELLBUTRIN SR) 150 MG 12 hr tablet Take 1 tablet (150 mg total) by mouth daily. 30 tablet 0  . calcium carbonate (TUMS - DOSED IN MG ELEMENTAL CALCIUM) 500 MG chewable tablet Chew 1 tablet by mouth daily.    . cholecalciferol (VITAMIN D3) 25 MCG (1000 UT) tablet Take 1,000 Units by mouth daily.    [name] ibuprofen (ADVIL,MOTRIN) 800 MG tablet Take 800 mg by mouth every 8 (eight) hours as needed.    . metFORMIN (GLUCOPHAGE-XR) 500 MG 24 hr tablet Take 1 tablet (500 mg total) by mouth daily with breakfast. 30 tablet 0  . naproxen (NAPROSYN) 500 MG tablet Reported on 06/25/2015  0  . Vitamin D, Ergocalciferol, (DRISDOL) 1.25 MG (50000 UT) CAPS capsule Take 1 capsule (50,000 Units total) by mouth every 7 (seven)  days. 4 capsule 0  . vitamin E 400 UNIT capsule Take 400 Units by mouth daily.     No current facility-administered medications on file prior to visit.     PAST MEDICAL HISTORY: Past Medical History:  Diagnosis Date  . Abnormal uterine bleeding (AUB)   . ADD (attention deficit disorder)   . Allergies   . AR (allergic rhinitis)   . Asthma   . Constipation   . Depression   . Endometriosis 2014  . Family history of breast cancer   . Fatigue   . Heavy periods   . Infertility, female   . Irregular periods   . Joint pain   . Knee MCL sprain   . Lactose intolerance   . Multiple food allergies    Bananas, Dates, Avocado, Citrus  . Painful menstrual periods   . Polycystic ovarian disease 2010  . Stress   . Vitamin D deficiency   . Yeast infection     PAST SURGICAL  HISTORY: Past Surgical History:  Procedure Laterality Date  . BREAST CYST EXCISION Right 2015   [facility] Removed cyst.   . BREAST SURGERY Bilateral 2004   breast reduction  . cryotherapy    . CYSTECTOMY Right 2015   breast  . DILATION AND CURETTAGE OF UTERUS    . HYSTEROSCOPY W/D&C N/A 05/13/2014   Procedure: DILATATION AND CURETTAGE /HYSTEROSCOPY;  Surgeon: Herold Harms, MD;  Location: [facility] ORS;  Service: Gynecology;  Laterality: N/A;  . MASS EXCISION N/A 2010   from uterus  . REDUCTION MAMMAPLASTY Bilateral 2004   Reduction     SOCIAL HISTORY: Social History   Tobacco Use  . Smoking status: Never Smoker  . Smokeless tobacco: Never Used  Substance Use Topics  . Alcohol use: Yes    Comment: occas  . Drug use: No    FAMILY HISTORY: Family History  Problem Relation Age of Onset  . Diabetes Father   . Colon cancer Father   . Obesity Father   . Breast cancer Mother   . Hyperlipidemia Mother   . Depression Maternal Grandfather   . Breast cancer Paternal Grandmother   . Heart disease Neg Hx   . Ovarian cancer Neg Hx     ROS: Review of Systems  Constitutional: Positive for weight loss.   Psychiatric/Behavioral: Positive for depression. Negative for suicidal ideas.    PHYSICAL EXAM: Pt in no acute distress  RECENT LABS AND TESTS: BMET    Component Value Date/Time   NA 137 02/06/2018 1033   NA 139 01/20/2011 0606   K 4.6 02/06/2018 1033   K 4.1 01/20/2011 0606   CL 104 02/06/2018 1033   CL 108 (H) 01/20/2011 0606   CO2 19 (L) 02/06/2018 1033   CO2 23 01/20/2011 0606   GLUCOSE 74 02/06/2018 1033   GLUCOSE 73 08/20/2014 1036   GLUCOSE 84 01/20/2011 0606   BUN 10 02/06/2018 1033   BUN 10 01/20/2011 0606   CREATININE 0.85 02/06/2018 1033   CREATININE 0.83 01/20/2011 0606   CALCIUM 9.2 02/06/2018 1033   CALCIUM 8.9 01/20/2011 0606   GFRNONAA 86 02/06/2018 1033   GFRNONAA >60 01/20/2011 0606   GFRAA 99 02/06/2018 1033   GFRAA >60 01/20/2011 0606   Lab Results  Component Value Date   HGBA1C 4.9 02/06/2018   HGBA1C 4.9 11/07/2015   Lab Results  Component Value Date   INSULIN 40.6 (H) 02/06/2018   CBC    Component Value Date/Time   WBC 5.2 02/06/2018 1033   WBC 8.5 08/20/2014 1036   RBC 4.41 02/06/2018 1033   RBC 4.00 08/20/2014 1036   HGB 12.6 02/06/2018 1033   HCT 39.6 02/06/2018 1033   PLT 282 11/07/2015 0900   MCV 90 02/06/2018 1033   MCV 90 01/20/2011 0606   MCH 28.6 02/06/2018 1033   MCH 27.5 08/20/2014 1036   MCHC 31.8 02/06/2018 1033   MCHC 31.8 (L) 08/20/2014 1036   RDW 13.6 02/06/2018 1033   RDW 13.5 01/20/2011 0606   LYMPHSABS 2.1 02/06/2018 1033   LYMPHSABS 1.7 01/20/2011 0606   MONOABS 0.6 08/20/2014 1036   MONOABS 0.4 01/20/2011 0606   EOSABS 0.1 02/06/2018 1033   EOSABS 0.1 01/20/2011 0606   BASOSABS 0.0 02/06/2018 1033   BASOSABS 0.0 01/20/2011 0606   Iron/TIBC/Ferritin/ %Sat    Component Value Date/Time   IRON 100 11/07/2015 0900   Lipid Panel     Component Value Date/Time   CHOL 173 02/06/2018 1033   TRIG  79 02/06/2018 1033   HDL 52 02/06/2018 1033   CHOLHDL 3.2 05/04/2017 0812   LDLCALC 105 (H) 02/06/2018 1033    Hepatic Function Panel     Component Value Date/Time   PROT 7.5 02/06/2018 1033   PROT 7.7 01/20/2011 0606   ALBUMIN 4.5 02/06/2018 1033   ALBUMIN 3.8 01/20/2011 0606   AST 14 02/06/2018 1033   AST 15 01/20/2011 0606   ALT 18 02/06/2018 1033   ALT 27 01/20/2011 0606   ALKPHOS 56 02/06/2018 1033   ALKPHOS 55 01/20/2011 0606   BILITOT 0.5 02/06/2018 1033   BILITOT 0.4 01/20/2011 0606      Component Value Date/Time   TSH 2.370 02/06/2018 1033   TSH 2.380 11/07/2015 0900   TSH 1.690 06/25/2015 1100      I, Burt Knack, am acting as transcriptionist for Debbra Riding, MD  I have reviewed the above documentation for accuracy and completeness, and I agree with the above. - Debbra Riding, MD

## 2018-05-30 ENCOUNTER — Ambulatory Visit (INDEPENDENT_AMBULATORY_CARE_PROVIDER_SITE_OTHER): Payer: PRIVATE HEALTH INSURANCE | Admitting: Family Medicine

## 2018-05-30 ENCOUNTER — Other Ambulatory Visit: Payer: Self-pay

## 2018-05-30 ENCOUNTER — Encounter (INDEPENDENT_AMBULATORY_CARE_PROVIDER_SITE_OTHER): Payer: Self-pay | Admitting: Family Medicine

## 2018-05-30 DIAGNOSIS — E8881 Metabolic syndrome: Secondary | ICD-10-CM

## 2018-05-30 DIAGNOSIS — E7849 Other hyperlipidemia: Secondary | ICD-10-CM | POA: Diagnosis not present

## 2018-05-30 DIAGNOSIS — Z6841 Body Mass Index (BMI) 40.0 and over, adult: Secondary | ICD-10-CM | POA: Diagnosis not present

## 2018-05-30 NOTE — Progress Notes (Signed)
Office: [phone]  /  Fax: [phone] TeleHealth Visit:  Michele Hansen has verbally consented to this TeleHealth visit today. The patient is located at home, the provider is located at the [facility] office. The participants in this visit include the listed provider and patient. The visit was conducted today via face time.  HPI:   Chief Complaint: OBESITY [name] is here to discuss her progress with her obesity treatment plan. She is on the keep a food journal with 450-600 calories and 40+ grams of protein at supper daily and follow the Category 3 plan and is following her eating plan approximately 75-80 % of the time. She states she is exercising 0 minutes 0 times per week. Michele Hansen has not weighed at home. She is currently at [facility] with her mom for her infusion. She did have her husband's birthday celebration which she did enjoy 2 cupcakes. She is still being mindful of all indulges.  We were unable to weigh the patient today for this TeleHealth visit. She feels as if she has maintained her weight since her last visit. She has lost 4-9 lbs since starting treatment with [name].  Insulin Resistance [name] has a diagnosis of insulin resistance based on her elevated fasting insulin level >5. Although [name]'s blood glucose readings are still under good control, insulin resistance puts her at greater risk of metabolic syndrome and diabetes. She notes occasional carbohydrate cravings and denies GI side effects of metformin. She continues to work on diet and exercise to decrease risk of diabetes.  Hyperlipidemia [name] has hyperlipidemia and has been trying to improve her cholesterol levels with intensive lifestyle modification including a low saturated fat diet, exercise and weight loss. Last LDL was elevated on last labs. She is not on statin and denies any chest pain, claudication or myalgias.  ASSESSMENT AND PLAN:  Insulin resistance  Other hyperlipidemia  Class 3 severe  obesity with serious comorbidity and body mass index (BMI) of 45.0 to 49.9 in adult, unspecified obesity type (HCC)  PLAN:  Insulin Resistance [name] will continue to work on weight loss, exercise, and decreasing simple carbohydrates in her diet to help decrease the risk of diabetes. We dicussed metformin including benefits and risks. She was informed that eating too many simple carbohydrates or too many calories at one sitting increases the likelihood of GI side effects. Michele Hansen agrees to continue taking metformin, no refill needed. Michele Hansen agrees to follow up with our clinic in 2 weeks as directed to monitor her progress.  Hyperlipidemia [name] was informed of the American Heart Association Guidelines emphasizing intensive lifestyle modifications as the first line treatment for hyperlipidemia. We discussed many lifestyle modifications today in depth, and Michele Hansen will continue to work on decreasing saturated fats such as fatty red meat, butter and many fried foods. She will also increase vegetables and lean protein in her diet and continue to work on exercise and weight loss efforts. We will repeat FLP at her first in person appointment. Khamora agrees to follow up with our clinic in 2 weeks.  Obesity Michele Hansen is currently in the action stage of change. As such, her goal is to continue with weight loss efforts She has agreed to keep a food journal with 450-600 calories and 40+ grams of protein at supper daily and follow the Category 3 plan [name] has been instructed to work up to a goal of 150 minutes of combined cardio and strengthening exercise per week for weight loss and overall health benefits. We discussed the  following Behavioral Modification Strategies today: increasing lean protein intake, increasing vegetables and work on meal planning and easy cooking plans, keeping heathy foods in the home, and planning for success   Michele Hansen has agreed to follow up with our clinic in 2 weeks. She was informed  of the importance of frequent follow up visits to maximize her success with intensive lifestyle modifications for her multiple health conditions.  ALLERGIES: Allergies  Allergen Reactions  . Banana Anaphylaxis and Nausea And Vomiting  . Latex Itching and Rash  . Avocado   . Citrus   . Date Seed Extract [Zizyphus Jujuba]   . Tape     MEDICATIONS: Current Outpatient Medications on File Prior to Visit  Medication Sig Dispense Refill  . acetaminophen (TYLENOL) 500 MG tablet Take 500 mg by mouth every 6 (six) hours as needed.    [name]. buPROPion (WELLBUTRIN SR) 150 MG 12 hr tablet Take 1 tablet (150 mg total) by mouth daily. 30 tablet 0  . calcium carbonate (TUMS - DOSED IN MG ELEMENTAL CALCIUM) 500 MG chewable tablet Chew 1 tablet by mouth daily.    . cholecalciferol (VITAMIN D3) 25 MCG (1000 UT) tablet Take 1,000 Units by mouth daily.    [name]. ibuprofen (ADVIL,MOTRIN) 800 MG tablet Take 800 mg by mouth every 8 (eight) hours as needed.    . metFORMIN (GLUCOPHAGE-XR) 500 MG 24 hr tablet Take 1 tablet (500 mg total) by mouth daily with breakfast. 30 tablet 0  . naproxen (NAPROSYN) 500 MG tablet Reported on 06/25/2015  0  . Vitamin D, Ergocalciferol, (DRISDOL) 1.25 MG (50000 UT) CAPS capsule Take 1 capsule (50,000 Units total) by mouth every 7 (seven) days. 4 capsule 0  . vitamin E 400 UNIT capsule Take 400 Units by mouth daily.     No current facility-administered medications on file prior to visit.     PAST MEDICAL HISTORY: Past Medical History:  Diagnosis Date  . Abnormal uterine bleeding (AUB)   . ADD (attention deficit disorder)   . Allergies   . AR (allergic rhinitis)   . Asthma   . Constipation   . Depression   . Endometriosis 2014  . Family history of breast cancer   . Fatigue   . Heavy periods   . Infertility, female   . Irregular periods   . Joint pain   . Knee MCL sprain   . Lactose intolerance   . Multiple food allergies    Bananas, Dates, Avocado, Citrus  . Painful  menstrual periods   . Polycystic ovarian disease 2010  . Stress   . Vitamin D deficiency   . Yeast infection     PAST SURGICAL HISTORY: Past Surgical History:  Procedure Laterality Date  . BREAST CYST EXCISION Right 2015   [facility] Removed cyst.   . BREAST SURGERY Bilateral 2004   breast reduction  . cryotherapy    . CYSTECTOMY Right 2015   breast  . DILATION AND CURETTAGE OF UTERUS    . HYSTEROSCOPY W/D&C N/A 05/13/2014   Procedure: DILATATION AND CURETTAGE /HYSTEROSCOPY;  Surgeon: Herold HarmsMartin A Defrancesco, MD;  Location: [facility] ORS;  Service: Gynecology;  Laterality: N/A;  . MASS EXCISION N/A 2010   from uterus  . REDUCTION MAMMAPLASTY Bilateral 2004   Reduction     SOCIAL HISTORY: Social History   Tobacco Use  . Smoking status: Never Smoker  . Smokeless tobacco: Never Used  Substance Use Topics  . Alcohol use: Yes    Comment: occas  . Drug  use: No    FAMILY HISTORY: Family History  Problem Relation Age of Onset  . Diabetes Father   . Colon cancer Father   . Obesity Father   . Breast cancer Mother   . Hyperlipidemia Mother   . Depression Maternal Grandfather   . Breast cancer Paternal Grandmother   . Heart disease Neg Hx   . Ovarian cancer Neg Hx     ROS: Review of Systems  Constitutional: Negative for weight loss.  Cardiovascular: Negative for chest pain and claudication.  Musculoskeletal: Negative for myalgias.    PHYSICAL EXAM: Pt in no acute distress  RECENT LABS AND TESTS: BMET    Component Value Date/Time   NA 137 02/06/2018 1033   NA 139 01/20/2011 0606   K 4.6 02/06/2018 1033   K 4.1 01/20/2011 0606   CL 104 02/06/2018 1033   CL 108 (H) 01/20/2011 0606   CO2 19 (L) 02/06/2018 1033   CO2 23 01/20/2011 0606   GLUCOSE 74 02/06/2018 1033   GLUCOSE 73 08/20/2014 1036   GLUCOSE 84 01/20/2011 0606   BUN 10 02/06/2018 1033   BUN 10 01/20/2011 0606   CREATININE 0.85 02/06/2018 1033   CREATININE 0.83 01/20/2011 0606   CALCIUM 9.2 02/06/2018 1033    CALCIUM 8.9 01/20/2011 0606   GFRNONAA 86 02/06/2018 1033   GFRNONAA >60 01/20/2011 0606   GFRAA 99 02/06/2018 1033   GFRAA >60 01/20/2011 0606   Lab Results  Component Value Date   HGBA1C 4.9 02/06/2018   HGBA1C 4.9 11/07/2015   Lab Results  Component Value Date   INSULIN 40.6 (H) 02/06/2018   CBC    Component Value Date/Time   WBC 5.2 02/06/2018 1033   WBC 8.5 08/20/2014 1036   RBC 4.41 02/06/2018 1033   RBC 4.00 08/20/2014 1036   HGB 12.6 02/06/2018 1033   HCT 39.6 02/06/2018 1033   PLT 282 11/07/2015 0900   MCV 90 02/06/2018 1033   MCV 90 01/20/2011 0606   MCH 28.6 02/06/2018 1033   MCH 27.5 08/20/2014 1036   MCHC 31.8 02/06/2018 1033   MCHC 31.8 (L) 08/20/2014 1036   RDW 13.6 02/06/2018 1033   RDW 13.5 01/20/2011 0606   LYMPHSABS 2.1 02/06/2018 1033   LYMPHSABS 1.7 01/20/2011 0606   MONOABS 0.6 08/20/2014 1036   MONOABS 0.4 01/20/2011 0606   EOSABS 0.1 02/06/2018 1033   EOSABS 0.1 01/20/2011 0606   BASOSABS 0.0 02/06/2018 1033   BASOSABS 0.0 01/20/2011 0606   Iron/TIBC/Ferritin/ %Sat    Component Value Date/Time   IRON 100 11/07/2015 0900   Lipid Panel     Component Value Date/Time   CHOL 173 02/06/2018 1033   TRIG 79 02/06/2018 1033   HDL 52 02/06/2018 1033   CHOLHDL 3.2 05/04/2017 0812   LDLCALC 105 (H) 02/06/2018 1033   Hepatic Function Panel     Component Value Date/Time   PROT 7.5 02/06/2018 1033   PROT 7.7 01/20/2011 0606   ALBUMIN 4.5 02/06/2018 1033   ALBUMIN 3.8 01/20/2011 0606   AST 14 02/06/2018 1033   AST 15 01/20/2011 0606   ALT 18 02/06/2018 1033   ALT 27 01/20/2011 0606   ALKPHOS 56 02/06/2018 1033   ALKPHOS 55 01/20/2011 0606   BILITOT 0.5 02/06/2018 1033   BILITOT 0.4 01/20/2011 0606      Component Value Date/Time   TSH 2.370 02/06/2018 1033   TSH 2.380 11/07/2015 0900   TSH 1.690 06/25/2015 1100      I,  Burt Knack, am acting as transcriptionist for Debbra Riding, MD  I have reviewed the above  documentation for accuracy and completeness, and I agree with the above. - Debbra Riding, MD

## 2018-06-07 ENCOUNTER — Other Ambulatory Visit (INDEPENDENT_AMBULATORY_CARE_PROVIDER_SITE_OTHER): Payer: Self-pay | Admitting: Family Medicine

## 2018-06-07 DIAGNOSIS — F3289 Other specified depressive episodes: Secondary | ICD-10-CM

## 2018-06-13 ENCOUNTER — Encounter (INDEPENDENT_AMBULATORY_CARE_PROVIDER_SITE_OTHER): Payer: Self-pay | Admitting: Family Medicine

## 2018-06-13 ENCOUNTER — Ambulatory Visit (INDEPENDENT_AMBULATORY_CARE_PROVIDER_SITE_OTHER): Payer: PRIVATE HEALTH INSURANCE | Admitting: Family Medicine

## 2018-06-13 ENCOUNTER — Other Ambulatory Visit: Payer: Self-pay

## 2018-06-13 DIAGNOSIS — F3289 Other specified depressive episodes: Secondary | ICD-10-CM | POA: Diagnosis not present

## 2018-06-13 DIAGNOSIS — Z6841 Body Mass Index (BMI) 40.0 and over, adult: Secondary | ICD-10-CM | POA: Diagnosis not present

## 2018-06-13 DIAGNOSIS — E559 Vitamin D deficiency, unspecified: Secondary | ICD-10-CM

## 2018-06-13 MED ORDER — BUPROPION HCL ER (SR) 150 MG PO TB12: 150.0000 mg | ORAL_TABLET | Freq: Every day | ORAL | 0 refills | Status: DC

## 2018-06-13 NOTE — Progress Notes (Signed)
Office: [phone]  /  Fax: [phone] TeleHealth Visit:  Michele NorrisMeteea J Hansen has verbally consented to this TeleHealth visit today. The patient is located at home, the provider is located at the [facility] office. The participants in this visit include the listed provider and patient. The visit was conducted today via Facetime.  HPI:   Chief Complaint: OBESITY Michele Hansen is here to discuss her progress with her obesity treatment plan. She is on the  follow the Category 3 plan and is following her eating plan approximately 75-80 % of the time. She states she is exercising 0 minutes 0 times per week. Michele Hansen is not weighing at home but she voices she is having significant increased in demands at work. She is trying to plan ahead for dinner to help keep on meal plan. She would like to start adding more physical activity at the end of July when school is over.  We were unable to weigh the patient today for this TeleHealth visit. She feels as if she has gained weight since her last visit. She has lost 4-7 lbs since starting treatment with us.  Vitamin D deficiency Michele Hansen has a diagnosis of vitamin D deficiency. She is currently taking vit D and denies nausea, vomiting or muscle weakness. She reports fatigue.   Depression with emotional eating behaviors Michele Hansen is struggling with emotional eating and using food for comfort to the extent that it is negatively impacting her health. She often snacks when she is not hungry. Michele Hansen sometimes feels she is out of control and then feels guilty that she made poor food choices. She has been working on behavior modification techniques to help reduce her emotional eating and has been somewhat successful. She shows no sign of suicidal or homicidal ideations. No side effects of Wellbutrin reported.   Depression screen [facility] 03/13/2018 02/22/2018 02/06/2018  Decreased Interest 0 0 2  Down, Depressed, Hopeless 0 0 2  PHQ - 2 Score 0 0 4  Altered sleeping  0 1 3  Tired, decreased energy 1 1 3   Change in appetite 0 0 3  Feeling bad or failure about yourself  0 0 3  Trouble concentrating 0 1 3  Moving slowly or fidgety/restless 0 0 0  Suicidal thoughts 0 0 0  PHQ-9 Score 1 3 19   Difficult doing work/chores - - Somewhat difficult      ASSESSMENT AND PLAN:  Vitamin D deficiency  Other depression - with emotional eating - Plan: buPROPion (WELLBUTRIN SR) 150 MG 12 hr tablet  Class 3 severe obesity with serious comorbidity and body mass index (BMI) of 45.0 to 49.9 in adult, unspecified obesity type (HCC)  PLAN: Vitamin D Deficiency Michele Hansen was informed that low vitamin D levels contributes to fatigue and are associated with obesity, breast, and colon cancer. She agrees to continue to take prescription Vit D @50 ,000 IU every week and will follow up for routine testing of vitamin D, at least 2-3 times per year. She was informed of the risk of over-replacement of vitamin D and agrees to not increase her dose unless she discusses this with us first.  Depression with Emotional Eating Behaviors We discussed behavior modification techniques today to help Michele Hansen deal with her emotional eating and depression. She has agreed to continue Wellbutrin SR 150 mg qd #30 with no refills and agreed to follow up as directed.  Obesity Michele Hansen is currently in the action stage of change. As such, her goal is to continue with weight  loss efforts She has agreed to follow the Category 3 plan Michele Hansen has been instructed to work up to a goal of 150 minutes of combined cardio and strengthening exercise per week for weight loss and overall health benefits. We discussed the following Behavioral Modification Stratagies today: keeping healthy foods in the home, better snacking choices, planning for success, increasing lean protein intake, increasing vegetables and work on meal planning and easy cooking plans   Michele Hansen has agreed to follow up with our clinic in 2 weeks. She  was informed of the importance of frequent follow up visits to maximize her success with intensive lifestyle modifications for her multiple health conditions.  ALLERGIES: Allergies  Allergen Reactions  . Banana Anaphylaxis and Nausea And Vomiting  . Latex Itching and Rash  . Avocado   . Citrus   . Date Seed Extract [Zizyphus Jujuba]   . Tape     MEDICATIONS: Current Outpatient Medications on File Prior to Visit  Medication Sig Dispense Refill  . acetaminophen (TYLENOL) 500 MG tablet Take 500 mg by mouth every 6 (six) hours as needed.    . calcium carbonate (TUMS - DOSED IN MG ELEMENTAL CALCIUM) 500 MG chewable tablet Chew 1 tablet by mouth daily.    . cholecalciferol (VITAMIN D3) 25 MCG (1000 UT) tablet Take 1,000 Units by mouth daily.    [name]. ibuprofen (ADVIL,MOTRIN) 800 MG tablet Take 800 mg by mouth every 8 (eight) hours as needed.    . metFORMIN (GLUCOPHAGE-XR) 500 MG 24 hr tablet Take 1 tablet (500 mg total) by mouth daily with breakfast. 30 tablet 0  . naproxen (NAPROSYN) 500 MG tablet Reported on 06/25/2015  0  . Vitamin D, Ergocalciferol, (DRISDOL) 1.25 MG (50000 UT) CAPS capsule Take 1 capsule (50,000 Units total) by mouth every 7 (seven) days. 4 capsule 0  . vitamin E 400 UNIT capsule Take 400 Units by mouth daily.     No current facility-administered medications on file prior to visit.     PAST MEDICAL HISTORY: Past Medical History:  Diagnosis Date  . Abnormal uterine bleeding (AUB)   . ADD (attention deficit disorder)   . Allergies   . AR (allergic rhinitis)   . Asthma   . Constipation   . Depression   . Endometriosis 2014  . Family history of breast cancer   . Fatigue   . Heavy periods   . Infertility, female   . Irregular periods   . Joint pain   . Knee MCL sprain   . Lactose intolerance   . Multiple food allergies    Bananas, Dates, Avocado, Citrus  . Painful menstrual periods   . Polycystic ovarian disease 2010  . Stress   . Vitamin D deficiency   .  Yeast infection     PAST SURGICAL HISTORY: Past Surgical History:  Procedure Laterality Date  . BREAST CYST EXCISION Right 2015   [facility] Removed cyst.   . BREAST SURGERY Bilateral 2004   breast reduction  . cryotherapy    . CYSTECTOMY Right 2015   breast  . DILATION AND CURETTAGE OF UTERUS    . HYSTEROSCOPY W/D&C N/A 05/13/2014   Procedure: DILATATION AND CURETTAGE /HYSTEROSCOPY;  Surgeon: Herold HarmsMartin A Defrancesco, MD;  Location: [facility] ORS;  Service: Gynecology;  Laterality: N/A;  . MASS EXCISION N/A 2010   from uterus  . REDUCTION MAMMAPLASTY Bilateral 2004   Reduction     SOCIAL HISTORY: Social History   Tobacco Use  . Smoking status: Never Smoker  .  Smokeless tobacco: Never Used  Substance Use Topics  . Alcohol use: Yes    Comment: occas  . Drug use: No    FAMILY HISTORY: Family History  Problem Relation Age of Onset  . Diabetes Father   . Colon cancer Father   . Obesity Father   . Breast cancer Mother   . Hyperlipidemia Mother   . Depression Maternal Grandfather   . Breast cancer Paternal Grandmother   . Heart disease Neg Hx   . Ovarian cancer Neg Hx     ROS: Review of Systems  Gastrointestinal: Negative for nausea and vomiting.  Musculoskeletal:       Negative for muscle weakness  Psychiatric/Behavioral: Positive for depression. Negative for suicidal ideas.    PHYSICAL EXAM: Pt in no acute distress  RECENT LABS AND TESTS: BMET    Component Value Date/Time   NA 137 02/06/2018 1033   NA 139 01/20/2011 0606   K 4.6 02/06/2018 1033   K 4.1 01/20/2011 0606   CL 104 02/06/2018 1033   CL 108 (H) 01/20/2011 0606   CO2 19 (L) 02/06/2018 1033   CO2 23 01/20/2011 0606   GLUCOSE 74 02/06/2018 1033   GLUCOSE 73 08/20/2014 1036   GLUCOSE 84 01/20/2011 0606   BUN 10 02/06/2018 1033   BUN 10 01/20/2011 0606   CREATININE 0.85 02/06/2018 1033   CREATININE 0.83 01/20/2011 0606   CALCIUM 9.2 02/06/2018 1033   CALCIUM 8.9 01/20/2011 0606   GFRNONAA 86 02/06/2018  1033   GFRNONAA >60 01/20/2011 0606   GFRAA 99 02/06/2018 1033   GFRAA >60 01/20/2011 0606   Lab Results  Component Value Date   HGBA1C 4.9 02/06/2018   HGBA1C 4.9 11/07/2015   Lab Results  Component Value Date   INSULIN 40.6 (H) 02/06/2018   CBC    Component Value Date/Time   WBC 5.2 02/06/2018 1033   WBC 8.5 08/20/2014 1036   RBC 4.41 02/06/2018 1033   RBC 4.00 08/20/2014 1036   HGB 12.6 02/06/2018 1033   HCT 39.6 02/06/2018 1033   PLT 282 11/07/2015 0900   MCV 90 02/06/2018 1033   MCV 90 01/20/2011 0606   MCH 28.6 02/06/2018 1033   MCH 27.5 08/20/2014 1036   MCHC 31.8 02/06/2018 1033   MCHC 31.8 (L) 08/20/2014 1036   RDW 13.6 02/06/2018 1033   RDW 13.5 01/20/2011 0606   LYMPHSABS 2.1 02/06/2018 1033   LYMPHSABS 1.7 01/20/2011 0606   MONOABS 0.6 08/20/2014 1036   MONOABS 0.4 01/20/2011 0606   EOSABS 0.1 02/06/2018 1033   EOSABS 0.1 01/20/2011 0606   BASOSABS 0.0 02/06/2018 1033   BASOSABS 0.0 01/20/2011 0606   Iron/TIBC/Ferritin/ %Sat    Component Value Date/Time   IRON 100 11/07/2015 0900   Lipid Panel     Component Value Date/Time   CHOL 173 02/06/2018 1033   TRIG 79 02/06/2018 1033   HDL 52 02/06/2018 1033   CHOLHDL 3.2 05/04/2017 0812   LDLCALC 105 (H) 02/06/2018 1033   Hepatic Function Panel     Component Value Date/Time   PROT 7.5 02/06/2018 1033   PROT 7.7 01/20/2011 0606   ALBUMIN 4.5 02/06/2018 1033   ALBUMIN 3.8 01/20/2011 0606   AST 14 02/06/2018 1033   AST 15 01/20/2011 0606   ALT 18 02/06/2018 1033   ALT 27 01/20/2011 0606   ALKPHOS 56 02/06/2018 1033   ALKPHOS 55 01/20/2011 0606   BILITOT 0.5 02/06/2018 1033   BILITOT 0.4 01/20/2011 0606  Component Value Date/Time   TSH 2.370 02/06/2018 1033   TSH 2.380 11/07/2015 0900   TSH 1.690 06/25/2015 1100    Ref. Range 02/06/2018 10:33  Vitamin D, 25-Hydroxy Latest Ref Range: 30.0 - 100.0 ng/mL 19.5 (L)      I, Jeralene PetersAshleigh Haynes, am acting as Energy managertranscriptionist for Debbra RidingAlexandria  Kadolph, MD   I have reviewed the above documentation for accuracy and completeness, and I agree with the above. - Debbra RidingAlexandria Kadolph, MD

## 2018-06-26 ENCOUNTER — Other Ambulatory Visit: Payer: Self-pay

## 2018-06-26 ENCOUNTER — Telehealth (INDEPENDENT_AMBULATORY_CARE_PROVIDER_SITE_OTHER): Payer: PRIVATE HEALTH INSURANCE | Admitting: Family Medicine

## 2018-06-26 DIAGNOSIS — E8881 Metabolic syndrome: Secondary | ICD-10-CM

## 2018-06-26 DIAGNOSIS — E559 Vitamin D deficiency, unspecified: Secondary | ICD-10-CM

## 2018-06-26 DIAGNOSIS — Z6841 Body Mass Index (BMI) 40.0 and over, adult: Secondary | ICD-10-CM

## 2018-06-27 NOTE — Progress Notes (Signed)
Office: [phone]  /  Fax: [phone] TeleHealth Visit:  Michele NorrisMeteea J Hansen has verbally consented to this TeleHealth visit today. The patient is located at home, the provider is located at the [facility] office. The participants in this visit include the listed provider and patient. The visit was conducted today via face time.  HPI:   Chief Complaint: OBESITY Michele Hansen is here to discuss her progress with her obesity treatment plan. She is on the Category 3 plan and is following her eating plan approximately 80 % of the time. She states she is exercising 0 minutes 0 times per week. Michele Hansen has been very busy the last few weeks. She hasn't done the best with meal planning, but she is trying to make good choices when eating out. She denies hunger or cravings. She voices that time will be the biggest obstacle in the next few weeks.  We were unable to weigh the patient today for this TeleHealth visit. She feels as if she has maintained her weight since her last visit. She has lost 7 lbs since starting treatment with us.  Vitamin D Deficiency Michele Hansen has a diagnosis of vitamin D deficiency. She is currently taking prescription Vit D. She notes fatigue and denies nausea, vomiting or muscle weakness.  Insulin Resistance Michele Hansen has a diagnosis of insulin resistance based on her elevated fasting insulin level >5. Although Michele Hansen's blood glucose readings are still under good control, insulin resistance puts her at greater risk of metabolic syndrome and diabetes. She notes occasional cravings especially around her menses. She is not taking metformin currently and continues to work on diet and exercise to decrease risk of diabetes.  ASSESSMENT AND PLAN:  Vitamin D deficiency  Insulin resistance  Class 3 severe obesity with serious comorbidity and body mass index (BMI) of 45.0 to 49.9 in adult, unspecified obesity type (HCC)  PLAN:  Vitamin D Deficiency Michele Hansen was informed that low  vitamin D levels contributes to fatigue and are associated with obesity, breast, and colon cancer. Michele Hansen agrees to continue taking prescription Vit D 50,000 IU every week and will follow up for routine testing of vitamin D, at least 2-3 times per year. She was informed of the risk of over-replacement of vitamin D and agrees to not increase her dose unless she discusses this with us first. Michele Hansen agrees to follow up with our clinic in 3 weeks.  Insulin Resistance Michele Hansen will continue to work on weight loss, exercise, and decreasing simple carbohydrates in her diet to help decrease the risk of diabetes. We dicussed metformin including benefits and risks. She was informed that eating too many simple carbohydrates or too many calories at one sitting increases the likelihood of GI side effects. Michele Hansen declined metformin for now and prescription was not written today. We will repeat labs at next appointment. Michele Hansen agrees to follow up with our clinic in 3 weeks as directed to monitor her progress.  Obesity Michele Hansen is currently in the action stage of change. As such, her goal is to continue with weight loss efforts She has agreed to follow the Category 3 plan Michele Hansen has been instructed to work up to a goal of 150 minutes of combined cardio and strengthening exercise per week for weight loss and overall health benefits. We discussed the following Behavioral Modification Strategies today: increasing lean protein intake, increasing vegetables and work on meal planning and easy cooking plans, keeping healthy foods in the home, and planning for success   Michele Hansen has agreed to  follow up with our clinic in 3 weeks. She was informed of the importance of frequent follow up visits to maximize her success with intensive lifestyle modifications for her multiple health conditions.  ALLERGIES: Allergies  Allergen Reactions  . Banana Anaphylaxis and Nausea And Vomiting  . Latex Itching and Rash  . Avocado   . Citrus    . Date Seed Extract [Zizyphus Jujuba]   . Tape     MEDICATIONS: Current Outpatient Medications on File Prior to Visit  Medication Sig Dispense Refill  . acetaminophen (TYLENOL) 500 MG tablet Take 500 mg by mouth every 6 (six) hours as needed.    [name] buPROPion (WELLBUTRIN SR) 150 MG 12 hr tablet Take 1 tablet (150 mg total) by mouth daily. 30 tablet 0  . calcium carbonate (TUMS - DOSED IN MG ELEMENTAL CALCIUM) 500 MG chewable tablet Chew 1 tablet by mouth daily.    . cholecalciferol (VITAMIN D3) 25 MCG (1000 UT) tablet Take 1,000 Units by mouth daily.    [name] ibuprofen (ADVIL,MOTRIN) 800 MG tablet Take 800 mg by mouth every 8 (eight) hours as needed.    . metFORMIN (GLUCOPHAGE-XR) 500 MG 24 hr tablet Take 1 tablet (500 mg total) by mouth daily with breakfast. 30 tablet 0  . naproxen (NAPROSYN) 500 MG tablet Reported on 06/25/2015  0  . Vitamin D, Ergocalciferol, (DRISDOL) 1.25 MG (50000 UT) CAPS capsule Take 1 capsule (50,000 Units total) by mouth every 7 (seven) days. 4 capsule 0  . vitamin E 400 UNIT capsule Take 400 Units by mouth daily.     No current facility-administered medications on file prior to visit.     PAST MEDICAL HISTORY: Past Medical History:  Diagnosis Date  . Abnormal uterine bleeding (AUB)   . ADD (attention deficit disorder)   . Allergies   . AR (allergic rhinitis)   . Asthma   . Constipation   . Depression   . Endometriosis 2014  . Family history of breast cancer   . Fatigue   . Heavy periods   . Infertility, female   . Irregular periods   . Joint pain   . Knee MCL sprain   . Lactose intolerance   . Multiple food allergies    Bananas, Dates, Avocado, Citrus  . Painful menstrual periods   . Polycystic ovarian disease 2010  . Stress   . Vitamin D deficiency   . Yeast infection     PAST SURGICAL HISTORY: Past Surgical History:  Procedure Laterality Date  . BREAST CYST EXCISION Right 2015   [facility] Removed cyst.   . BREAST SURGERY Bilateral 2004   breast  reduction  . cryotherapy    . CYSTECTOMY Right 2015   breast  . DILATION AND CURETTAGE OF UTERUS    . HYSTEROSCOPY W/D&C N/A 05/13/2014   Procedure: DILATATION AND CURETTAGE /HYSTEROSCOPY;  Surgeon: Brayton Mars, MD;  Location: [facility] ORS;  Service: Gynecology;  Laterality: N/A;  . MASS EXCISION N/A 2010   from uterus  . REDUCTION MAMMAPLASTY Bilateral 2004   Reduction     SOCIAL HISTORY: Social History   Tobacco Use  . Smoking status: Never Smoker  . Smokeless tobacco: Never Used  Substance Use Topics  . Alcohol use: Yes    Comment: occas  . Drug use: No    FAMILY HISTORY: Family History  Problem Relation Age of Onset  . Diabetes Father   . Colon cancer Father   . Obesity Father   . Breast cancer Mother   .  Hyperlipidemia Mother   . Depression Maternal Grandfather   . Breast cancer Paternal Grandmother   . Heart disease Neg Hx   . Ovarian cancer Neg Hx     ROS: Review of Systems  Constitutional: Positive for malaise/fatigue. Negative for weight loss.  Gastrointestinal: Negative for nausea and vomiting.  Musculoskeletal:       Negative muscle weakness    PHYSICAL EXAM: Pt in no acute distress  RECENT LABS AND TESTS: BMET    Component Value Date/Time   NA 137 02/06/2018 1033   NA 139 01/20/2011 0606   K 4.6 02/06/2018 1033   K 4.1 01/20/2011 0606   CL 104 02/06/2018 1033   CL 108 (H) 01/20/2011 0606   CO2 19 (L) 02/06/2018 1033   CO2 23 01/20/2011 0606   GLUCOSE 74 02/06/2018 1033   GLUCOSE 73 08/20/2014 1036   GLUCOSE 84 01/20/2011 0606   BUN 10 02/06/2018 1033   BUN 10 01/20/2011 0606   CREATININE 0.85 02/06/2018 1033   CREATININE 0.83 01/20/2011 0606   CALCIUM 9.2 02/06/2018 1033   CALCIUM 8.9 01/20/2011 0606   GFRNONAA 86 02/06/2018 1033   GFRNONAA >60 01/20/2011 0606   GFRAA 99 02/06/2018 1033   GFRAA >60 01/20/2011 0606   Lab Results  Component Value Date   HGBA1C 4.9 02/06/2018   HGBA1C 4.9 11/07/2015   Lab Results  Component  Value Date   INSULIN 40.6 (H) 02/06/2018   CBC    Component Value Date/Time   WBC 5.2 02/06/2018 1033   WBC 8.5 08/20/2014 1036   RBC 4.41 02/06/2018 1033   RBC 4.00 08/20/2014 1036   HGB 12.6 02/06/2018 1033   HCT 39.6 02/06/2018 1033   PLT 282 11/07/2015 0900   MCV 90 02/06/2018 1033   MCV 90 01/20/2011 0606   MCH 28.6 02/06/2018 1033   MCH 27.5 08/20/2014 1036   MCHC 31.8 02/06/2018 1033   MCHC 31.8 (L) 08/20/2014 1036   RDW 13.6 02/06/2018 1033   RDW 13.5 01/20/2011 0606   LYMPHSABS 2.1 02/06/2018 1033   LYMPHSABS 1.7 01/20/2011 0606   MONOABS 0.6 08/20/2014 1036   MONOABS 0.4 01/20/2011 0606   EOSABS 0.1 02/06/2018 1033   EOSABS 0.1 01/20/2011 0606   BASOSABS 0.0 02/06/2018 1033   BASOSABS 0.0 01/20/2011 0606   Iron/TIBC/Ferritin/ %Sat    Component Value Date/Time   IRON 100 11/07/2015 0900   Lipid Panel     Component Value Date/Time   CHOL 173 02/06/2018 1033   TRIG 79 02/06/2018 1033   HDL 52 02/06/2018 1033   CHOLHDL 3.2 05/04/2017 0812   LDLCALC 105 (H) 02/06/2018 1033   Hepatic Function Panel     Component Value Date/Time   PROT 7.5 02/06/2018 1033   PROT 7.7 01/20/2011 0606   ALBUMIN 4.5 02/06/2018 1033   ALBUMIN 3.8 01/20/2011 0606   AST 14 02/06/2018 1033   AST 15 01/20/2011 0606   ALT 18 02/06/2018 1033   ALT 27 01/20/2011 0606   ALKPHOS 56 02/06/2018 1033   ALKPHOS 55 01/20/2011 0606   BILITOT 0.5 02/06/2018 1033   BILITOT 0.4 01/20/2011 0606      Component Value Date/Time   TSH 2.370 02/06/2018 1033   TSH 2.380 11/07/2015 0900   TSH 1.690 06/25/2015 1100      I, Burt KnackSharon Martin, am acting as Energy managertranscriptionist for Debbra RidingAlexandria Kadolph, MD  I have reviewed the above documentation for accuracy and completeness, and I agree with the above. - Debbra RidingAlexandria Kadolph, MD

## 2018-07-05 ENCOUNTER — Other Ambulatory Visit (INDEPENDENT_AMBULATORY_CARE_PROVIDER_SITE_OTHER): Payer: Self-pay | Admitting: Family Medicine

## 2018-07-05 DIAGNOSIS — F3289 Other specified depressive episodes: Secondary | ICD-10-CM

## 2018-08-25 ENCOUNTER — Ambulatory Visit
Admission: EM | Admit: 2018-08-25 | Discharge: 2018-08-25 | Disposition: A | Discharge status: Home / Self Care | Payer: Managed Care, Other (non HMO) | Attending: Family Medicine | Admitting: Family Medicine

## 2018-08-25 ENCOUNTER — Encounter: Payer: Self-pay | Admitting: Emergency Medicine

## 2018-08-25 ENCOUNTER — Other Ambulatory Visit: Payer: Self-pay

## 2018-08-25 DIAGNOSIS — J0100 Acute maxillary sinusitis, unspecified: Secondary | ICD-10-CM

## 2018-08-25 DIAGNOSIS — J0110 Acute frontal sinusitis, unspecified: Secondary | ICD-10-CM

## 2018-08-25 MED ORDER — AMOXICILLIN-POT CLAVULANATE 875-125 MG PO TABS: 1.0000 | ORAL_TABLET | Freq: Two times a day (BID) | ORAL | 0 refills | Status: DC

## 2018-08-25 NOTE — ED Provider Notes (Signed)
[facility]    CSN: [id number] Arrival date & time: 08/25/18  1503      History   Chief Complaint Chief Complaint  Patient presents with   Eye Problem    left    HPI Michele Hansen is a 43 y.o. female.   43 yo female with a c/o left sided sinus pressure/headache/congestion for the past 3 days. States she noticed slight eye redness to the left eye today. Denies any injuries to the eye, drainage, blurred vision. States she has chronic allergies,takes over the counter allergy medication and has a h/o chronic sinus infections. States she was treated for a sinus infection with zithromax one month ago but feels that it didn't completely resolve. Denies any fevers, chills.    Eye Problem   Past Medical History:  Diagnosis Date   Abnormal uterine bleeding (AUB)    ADD (attention deficit disorder)    Allergies    AR (allergic rhinitis)    Asthma    Constipation    Depression    Endometriosis 2014   Family history of breast cancer    Fatigue    Heavy periods    Infertility, female    Irregular periods    Joint pain    Knee MCL sprain    Lactose intolerance    Multiple food allergies    Bananas, Dates, Avocado, Citrus   Painful menstrual periods    Polycystic ovarian disease 2010   Stress    Vitamin D deficiency    Yeast infection     Patient Active Problem List   Diagnosis Date Noted   Adenomyosis 08/20/2014   Allergic state 08/20/2014   Acute bronchospasm 08/20/2014   Excessive and frequent menstruation with irregular cycle 08/20/2014   Adiposity 06/04/2012   Bilateral polycystic ovarian syndrome 06/04/2012   Dysmenorrhea 01/20/2011    Past Surgical History:  Procedure Laterality Date   BREAST CYST EXCISION Right 2015   [facility] Removed cyst.    BREAST SURGERY Bilateral 2004   breast reduction   cryotherapy     CYSTECTOMY Right 2015   breast   DILATION AND CURETTAGE OF UTERUS     HYSTEROSCOPY W/D&C N/A  05/13/2014   Procedure: DILATATION AND CURETTAGE /HYSTEROSCOPY;  Surgeon: Herold HarmsMartin A Defrancesco, MD;  Location: [facility] ORS;  Service: Gynecology;  Laterality: N/A;   MASS EXCISION N/A 2010   from uterus   REDUCTION MAMMAPLASTY Bilateral 2004   Reduction     OB History    Gravida  0   Para  0   Term  0   Preterm  0   AB  0   Living  0     SAB  0   TAB  0   Ectopic  0   Multiple  0   Live Births               Home Medications    Prior to Admission medications   Medication Sig Start Date End Date Taking? Authorizing Provider  buPROPion (WELLBUTRIN SR) 150 MG 12 hr tablet Take 1 tablet (150 mg total) by mouth daily. 06/13/18  Yes Filbert SchilderKadolph, Alexandria U, MD  Vitamin D, Ergocalciferol, (DRISDOL) 1.25 MG (50000 UT) CAPS capsule Take 1 capsule (50,000 Units total) by mouth every 7 (seven) days. 02/20/18  Yes Quillian QuinceBeasley, Caren D, MD  vitamin E 400 UNIT capsule Take 400 Units by mouth daily.   Yes [provider]  acetaminophen (TYLENOL) 500 MG tablet Take 500 mg by mouth  every 6 (six) hours as needed.    [provider]  amoxicillin-clavulanate (AUGMENTIN) 875-125 MG tablet Take 1 tablet by mouth 2 (two) times daily. 08/25/18   Payton Mccallumonty, Calvert Charland, MD  calcium carbonate (TUMS - DOSED IN MG ELEMENTAL CALCIUM) 500 MG chewable tablet Chew 1 tablet by mouth daily.    [provider]  cholecalciferol (VITAMIN D3) 25 MCG (1000 UT) tablet Take 1,000 Units by mouth daily.    [provider]  ibuprofen (ADVIL,MOTRIN) 800 MG tablet Take 800 mg by mouth every 8 (eight) hours as needed.    [provider]  metFORMIN (GLUCOPHAGE-XR) 500 MG 24 hr tablet Take 1 tablet (500 mg total) by mouth daily with breakfast. 02/20/18   Quillian QuinceBeasley, Caren D, MD  naproxen (NAPROSYN) 500 MG tablet Reported on 06/25/2015 10/23/14   [provider]    Family History Family History  Problem Relation Age of Onset   Diabetes Father    Colon cancer Father    Obesity  Father    Breast cancer Mother    Hyperlipidemia Mother    Depression Maternal Grandfather    Breast cancer Paternal Grandmother    Heart disease Neg Hx    Ovarian cancer Neg Hx     Social History Social History   Tobacco Use   Smoking status: Never Smoker   Smokeless tobacco: Never Used  Substance Use Topics   Alcohol use: Yes    Comment: occas   Drug use: No     Allergies   Banana, Latex, Avocado, Citrus, Date seed extract [zizyphus jujuba], and Tape   Review of Systems Review of Systems   Physical Exam Triage Vital Signs ED Triage Vitals  Enc Vitals Group     BP 08/25/18 1517 128/88     Pulse Rate 08/25/18 1517 91     Resp 08/25/18 1517 16     Temp 08/25/18 1517 97.7 F (36.5 C)     Temp Source 08/25/18 1517 Oral     SpO2 08/25/18 1517 100 %     Weight 08/25/18 1513 290 lb (131.5 kg)     Height 08/25/18 1513 5\' 8"  (1.727 m)     Head Circumference --      Peak Flow --      Pain Score 08/25/18 1513 6     Pain Loc --      Pain Edu? --      Excl. in GC? --    No data found.  Updated Vital Signs BP 128/88 (BP Location: Left Arm)    Pulse 91    Temp 97.7 F (36.5 C) (Oral)    Resp 16    Ht 5\' 8"  (1.727 m)    Wt 131.5 kg    LMP 07/28/2018 (Approximate)    SpO2 100%    BMI 44.09 kg/m   Visual Acuity Right Eye Distance: 20/15 corrected Left Eye Distance: 20/15 corrected Bilateral Distance: 20/15 corrected  Right Eye Near:   Left Eye Near:    Bilateral Near:     Physical Exam Vitals signs and nursing note reviewed.  Constitutional:      General: She is not in acute distress.    Appearance: Normal appearance. She is not toxic-appearing or diaphoretic.  HENT:     Right Ear: Tympanic membrane normal.     Left Ear: A middle ear effusion is present.     Nose:     Right Sinus: No maxillary sinus tenderness or frontal sinus tenderness.  Left Sinus: Maxillary sinus tenderness and frontal sinus tenderness present.  Eyes:     General: Lids are  normal. Vision grossly intact. Gaze aligned appropriately.        Left eye: No discharge.     Extraocular Movements: Extraocular movements intact.     Conjunctiva/sclera:     Left eye: Left conjunctiva is injected (slightly; mildly). No exudate.    Pupils: Pupils are equal, round, and reactive to light.  Pulmonary:     Effort: Pulmonary effort is normal. No respiratory distress.     Breath sounds: No wheezing.  Neurological:     Mental Status: She is alert.      UC Treatments / Results  Labs (all labs ordered are listed, but only abnormal results are displayed) Labs Reviewed - No data to display  EKG   Radiology No results found.  Procedures Procedures (including critical care time)  Medications Ordered in UC Medications - No data to display  Initial Impression / Assessment and Plan / UC Course  I have reviewed the triage vital signs and the nursing notes.  Pertinent labs & imaging results that were available during my care of the patient were reviewed by me and considered in my medical decision making (see chart for details).      Final Clinical Impressions(s) / UC Diagnoses   Final diagnoses:  Acute maxillary sinusitis, recurrence not specified  Acute frontal sinusitis, recurrence not specified   Discharge Instructions   None    ED Prescriptions    Medication Sig Dispense Auth. Provider   amoxicillin-clavulanate (AUGMENTIN) 875-125 MG tablet Take 1 tablet by mouth 2 (two) times daily. 20 tablet Norval Gable, MD      1. diagnosis reviewed with patient 2. rx as per orders above; reviewed possible side effects, interactions, risks and benefits  3. Recommend supportive treatment with otc flonase, decongestant 4. Follow-up prn if symptoms worsen or don't improve  Controlled Substance Prescriptions [place] Controlled Substance Registry consulted? Not Applicable   Norval Gable, MD 08/25/18 1550

## 2018-08-25 NOTE — ED Triage Notes (Signed)
Patient c/o left sided sinus pressure and behind her left eye that started Wed.  Patient also reports redness and irritation under left eye.

## 2019-02-16 ENCOUNTER — Other Ambulatory Visit: Payer: Self-pay

## 2019-02-16 ENCOUNTER — Emergency Department: Payer: Managed Care, Other (non HMO)

## 2019-02-16 ENCOUNTER — Emergency Department
Admission: EM | Admit: 2019-02-16 | Discharge: 2019-02-16 | Disposition: A | Discharge status: Home / Self Care | Payer: Managed Care, Other (non HMO) | Attending: Emergency Medicine | Admitting: Emergency Medicine

## 2019-02-16 DIAGNOSIS — R519 Headache, unspecified: Secondary | ICD-10-CM | POA: Diagnosis present

## 2019-02-16 DIAGNOSIS — Z7984 Long term (current) use of oral hypoglycemic drugs: Secondary | ICD-10-CM | POA: Insufficient documentation

## 2019-02-16 DIAGNOSIS — J45909 Unspecified asthma, uncomplicated: Secondary | ICD-10-CM | POA: Insufficient documentation

## 2019-02-16 DIAGNOSIS — Y92410 Unspecified street and highway as the place of occurrence of the external cause: Secondary | ICD-10-CM | POA: Insufficient documentation

## 2019-02-16 DIAGNOSIS — Y9389 Activity, other specified: Secondary | ICD-10-CM | POA: Diagnosis not present

## 2019-02-16 DIAGNOSIS — Y998 Other external cause status: Secondary | ICD-10-CM | POA: Insufficient documentation

## 2019-02-16 DIAGNOSIS — M542 Cervicalgia: Secondary | ICD-10-CM | POA: Diagnosis not present

## 2019-02-16 DIAGNOSIS — Z91040 Latex allergy status: Secondary | ICD-10-CM | POA: Insufficient documentation

## 2019-02-16 DIAGNOSIS — M545 Low back pain: Secondary | ICD-10-CM | POA: Diagnosis not present

## 2019-02-16 MED ORDER — MELOXICAM 15 MG PO TABS: 15.0000 mg | ORAL_TABLET | Freq: Every day | ORAL | 1 refills | Status: AC

## 2019-02-16 MED ORDER — ACETAMINOPHEN 500 MG PO TABS
1000.0000 mg | ORAL_TABLET | Freq: Once | ORAL | Status: AC
  Administered 2019-02-16: 1000 mg via ORAL
  Filled 2019-02-16: qty 2

## 2019-02-16 MED ORDER — MELOXICAM 15 MG PO TABS: 15.0000 mg | ORAL_TABLET | Freq: Every day | ORAL | 1 refills | Status: DC

## 2019-02-16 MED ORDER — METHOCARBAMOL 500 MG PO TABS: 500.0000 mg | ORAL_TABLET | Freq: Three times a day (TID) | ORAL | 0 refills | Status: AC | PRN

## 2019-02-16 MED ORDER — METHOCARBAMOL 500 MG PO TABS: 500.0000 mg | ORAL_TABLET | Freq: Three times a day (TID) | ORAL | 0 refills | Status: DC | PRN
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
## 2019-09-21 ENCOUNTER — Other Ambulatory Visit: Payer: Self-pay

## 2019-09-21 ENCOUNTER — Encounter: Payer: Self-pay | Admitting: Nurse Practitioner

## 2019-09-21 ENCOUNTER — Ambulatory Visit: Payer: Managed Care, Other (non HMO) | Admitting: Nurse Practitioner

## 2019-09-21 VITALS — BP 136/90 | HR 98 | Temp 97.6°F | Resp 18 | Wt 320.0 lb

## 2019-09-21 DIAGNOSIS — Z0000 Encounter for general adult medical examination without abnormal findings: Secondary | ICD-10-CM | POA: Diagnosis not present

## 2019-09-21 DIAGNOSIS — Z008 Encounter for other general examination: Secondary | ICD-10-CM

## 2019-09-21 NOTE — Progress Notes (Signed)
Subjective:     Patient ID: Michele Hansen, female   DOB: Feb 26, 1975, 44 y.o.   MRN: [id number]  HPI  Michele Hansen is a 44 y.o. female who presents to the [facility] for her biometric screening exam. 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 She is very excited about her new role and the support she is getting from her co-workers. This employee reports no problems today but her weight. She has not done well with diet and exercise due to being back in school and working but has started a program of walking 2 miles a day and trying to have a better diet.  PCP: [facility], GYN: [name]  Meds: Tylenol prn, Wellbutrin, Tums, Advil prn, Vit D, Vit E, gabapentin, nuvaring   PMH: Adenomyosis, PCOS, Dysmenorrhea, Obesity, GERD Surg: Uterine mass-benign SH: denies use of tobacco or drugs, occasional ETOH use Immunizations: UTD Diet/exercise: starting a program with healthy diet and is walking 2 miles a day.  Review of Systems  Constitutional:       Obesity  All other systems reviewed and are negative.      Objective: BP 136/90 (BP Location: Right Arm, Patient Position: Sitting, Cuff Size: Large)   Pulse 98   Temp 97.6 F (36.4 C) (Temporal)   Resp 18   Wt (!) 320 lb (145.2 kg)   SpO2 98%   BMI 48.66 kg/m     Physical Exam Constitutional:      General: She is not in acute distress.    Appearance: She is obese.  HENT:     Head: Normocephalic and atraumatic.     Jaw: No trismus.     Right Ear: Tympanic membrane, ear canal and external ear normal.     Left Ear: Tympanic membrane, ear canal and external ear normal.     Nose: Nose normal.     Mouth/Throat:     Mouth: Mucous membranes are moist.     Pharynx: Oropharynx is clear.  Eyes:     Extraocular Movements: Extraocular movements intact.      Conjunctiva/sclera: Conjunctivae normal.     Pupils: Pupils are equal, round, and reactive to light.  Neck:     Thyroid: No thyroid mass or thyroid tenderness.     Vascular: Normal carotid pulses. No carotid bruit or JVD.     Trachea: Trachea normal. No tracheal tenderness.  Cardiovascular:     Rate and Rhythm: Normal rate and regular rhythm.     Heart sounds: No murmur heard.   Pulmonary:     Effort: Pulmonary effort is normal.     Breath sounds: Normal breath sounds.  Abdominal:     Tenderness: There is no abdominal tenderness.  Musculoskeletal:     Cervical back: Normal range of motion. No rigidity or tenderness.  Lymphadenopathy:     Cervical: No cervical adenopathy.  Skin:    General: Skin is warm and dry.  Neurological:     General: No focal deficit present.     Mental Status: She is alert.     Motor: Motor function is intact.     Coordination: Romberg sign negative.     Gait: Gait is intact.     Deep Tendon Reflexes:     Reflex Scores:  Bicep reflexes are 2+ on the right side and 2+ on the left side.      Brachioradialis reflexes are 2+ on the right side and 2+ on the left side.      Patellar reflexes are 2+ on the right side and 2+ on the left side.    Comments: Ambulatory with steady gait. Stands on one foot without difficulty. Bends without pain. Grips are equal, radial pulses 2+.  Psychiatric:        Mood and Affect: Mood normal.        Behavior: Behavior normal.        Thought Content: Thought content normal.        Cognition and Memory: Cognition normal.        Assessment:    1. Encounter for other general examination  2. Encounter for biometric screening - Glucose, random - Lipid panel  3. Encounter for preventive health examination     Plan:    Discussed with the employee diet and exercise plan, discussed immunizations. Employee given opportunity to ask questions. All questions answered and employee voices understanding. Plan to f/u in one  year or sooner if needed.

## 2019-09-21 NOTE — Patient Instructions (Signed)
Medical Screening Exam  A medical screening exam helps determine whether or not you need immediate medical treatment. This type of exam may be done in the emergency department, an urgent care setting, or your health care provider's office. During the exam, a health care provider does a short physical exam and asks about your medical history to assess:  Your current symptoms.  Your overall health. Depending on your symptoms, you may need additional tests. What are the possible outcomes of a medical screening exam? Your medical screening exam may determine that:  You do not need emergency treatment at this time.  You need treatment right away.  You need to be transferred to another medical center.  You need to have more tests. A medical specialist may be consulted if necessary. When should I seek medical care? If you have a regular health care provider, make an appointment for a follow-up visit with him or her. If you do not have a regular health care provider, ask about resources in your community. Get help right away if:  Your condition gets worse or you develop new or troubling symptoms before you see your health care provider. If this occurs, go to an emergency department right away. In an emergency:  Call 911 or have someone drive you to the nearest hospital.  Do not drive yourself. Summary  A medical screening exam helps determine whether or not you need immediate medical treatment.  During the exam, a health care provider does a short physical exam and asks about your current symptoms and overall health.  More tests may be ordered during the exam.  You may need to be transferred to another medical center. This information is not intended to replace advice given to you by your health care provider. Make sure you discuss any questions you have with your health care provider. Document Revised: 04/14/2018 Document Reviewed: 07/19/2017 Elsevier Patient Education  [place] Maintenance, Female Adopting a healthy lifestyle and getting preventive care are important in promoting health and wellness. Ask your health care provider about:  The right schedule for you to have regular tests and exams.  Things you can do on your own to prevent diseases and keep yourself healthy. What should I know about diet, weight, and exercise? Eat a healthy diet   Eat a diet that includes plenty of vegetables, fruits, low-fat dairy products, and lean protein.  Do not eat a lot of foods that are high in solid fats, added sugars, or sodium. Maintain a healthy weight Body mass index (BMI) is used to identify weight problems. It estimates body fat based on height and weight. Your health care provider can help determine your BMI and help you achieve or maintain a healthy weight. Get regular exercise Get regular exercise. This is one of the most important things you can do for your health. Most adults should:  Exercise for at least 150 minutes each week. The exercise should increase your heart rate and make you sweat (moderate-intensity exercise).  Do strengthening exercises at least twice a week. This is in addition to the moderate-intensity exercise.  Spend less time sitting. Even light physical activity can be beneficial. Watch cholesterol and blood lipids Have your blood tested for lipids and cholesterol at 42 years of age, then have this test every 5 years. Have your cholesterol levels checked more often if:  Your lipid or cholesterol levels are high.  You are older than 42 years of age.  You are at high  risk for heart disease. What should I know about cancer screening? Depending on your health history and family history, you may need to have cancer screening at various ages. This may include screening for:  Breast cancer.  Cervical cancer.  Colorectal cancer.  Skin cancer.  Lung cancer. What should I know about heart disease, diabetes, and  high blood pressure? Blood pressure and heart disease  High blood pressure causes heart disease and increases the risk of stroke. This is more likely to develop in people who have high blood pressure readings, are of African descent, or are overweight.  Have your blood pressure checked: ? Every 3-5 years if you are 61-39 years of age. ? Every year if you are 50 years old or older. Diabetes Have regular diabetes screenings. This checks your fasting blood sugar level. Have the screening done:  Once every three years after age 52 if you are at a normal weight and have a low risk for diabetes.  More often and at a younger age if you are overweight or have a high risk for diabetes. What should I know about preventing infection? Hepatitis B If you have a higher risk for hepatitis B, you should be screened for this virus. Talk with your health care provider to find out if you are at risk for hepatitis B infection. Hepatitis C Testing is recommended for:  Everyone born from [birth year] through 1963.  Anyone with known risk factors for hepatitis C. Sexually transmitted infections (STIs)  Get screened for STIs, including gonorrhea and chlamydia, if: ? You are sexually active and are younger than 42 years of age. ? You are older than 42 years of age and your health care provider tells you that you are at risk for this type of infection. ? Your sexual activity has changed since you were last screened, and you are at increased risk for chlamydia or gonorrhea. Ask your health care provider if you are at risk.  Ask your health care provider about whether you are at high risk for HIV. Your health care provider may recommend a prescription medicine to help prevent HIV infection. If you choose to take medicine to prevent HIV, you should first get tested for HIV. You should then be tested every 3 months for as long as you are taking the medicine. Pregnancy  If you are about to stop having your period  (premenopausal) and you may become pregnant, seek counseling before you get pregnant.  Take 400 to 800 micrograms (mcg) of folic acid every day if you become pregnant.  Ask for birth control (contraception) if you want to prevent pregnancy. Osteoporosis and menopause Osteoporosis is a disease in which the bones lose minerals and strength with aging. This can result in bone fractures. If you are 59 years old or older, or if you are at risk for osteoporosis and fractures, ask your health care provider if you should:  Be screened for bone loss.  Take a calcium or vitamin D supplement to lower your risk of fractures.  Be given hormone replacement therapy (HRT) to treat symptoms of menopause. Follow these instructions at home: Lifestyle  Do not use any products that contain nicotine or tobacco, such as cigarettes, e-cigarettes, and chewing tobacco. If you need help quitting, ask your health care provider.  Do not use street drugs.  Do not share needles.  Ask your health care provider for help if you need support or information about quitting drugs. Alcohol use  Do not drink alcohol if: ?  Your health care provider tells you not to drink. ? You are pregnant, may be pregnant, or are planning to become pregnant.  If you drink alcohol: ? Limit how much you use to 0-1 drink a day. ? Limit intake if you are breastfeeding.  Be aware of how much alcohol is in your drink. In the [place] one drink equals one 12 oz bottle of beer (355 mL), one 5 oz glass of wine (148 mL), or one 1 oz glass of hard liquor (44 mL). General instructions  Schedule regular health, dental, and eye exams.  Stay current with your vaccines.  Tell your health care provider if: ? You often feel depressed. ? You have ever been abused or do not feel safe at home. Summary  Adopting a healthy lifestyle and getting preventive care are important in promoting health and wellness.  Follow your health care provider's  instructions about healthy diet, exercising, and getting tested or screened for diseases.  Follow your health care provider's instructions on monitoring your cholesterol and blood pressure. This information is not intended to replace advice given to you by your health care provider. Make sure you discuss any questions you have with your health care provider. Document Revised: 12/14/2017 Document Reviewed: 12/14/2017 Elsevier Patient Education  2020 [employer].  Immunization Schedule, 60-85 Years Old  Vaccines are usually given at various ages, according to a schedule. Your health care provider will recommend vaccines for you based on your age, medical history, and lifestyle or other factors, such as travel or where you work. You may receive vaccines as individual doses or as more than one vaccine together in one shot (combination vaccines). Talk with your health care provider about the risks and benefits of combination vaccines. Recommended immunizations for 61-22 years old Influenza vaccine  You should get a dose of the influenza vaccine every year. Tetanus, diphtheria, and pertussis vaccine A vaccine that protects against tetanus, diphtheria, and pertussis is known as the Tdap vaccine. A vaccine that protects against tetanus and diphtheria is known as the Td vaccine.  You should only get the Td vaccine if you have had at least 1 dose of the Tdap vaccine.  You should get 1 dose of the Td or Tdap vaccine every 10 years, or you should get 1 dose of the Tdap vaccine if: ? You have not previously gotten a Tdap vaccine. ? You do not know if you have ever gotten a Tdap vaccine. ? You are between 27 and [redacted] weeks pregnant. Measles, mumps, and rubella vaccine This is also known as the MMR vaccine. You may need to get the MMR vaccine if:  You need to catch up on doses you missed in the past.  You have not been given the vaccine before.  You do not have evidence of immunity (by a blood  test). Pregnant women should not get the MMR vaccine during pregnancy because it may be harmful to the unborn baby. However, if you are not immune to measles, mumps, or rubella, you should get a dose of MMR vaccine one month or more before pregnancy or within days after delivery. Varicella vaccine This is also known as the VAR vaccine. You may need to get the VAR vaccine if you were born in 1980 or later and:  You need to catch up on doses you missed in the past.  You have not been given the vaccine before.  You do not have evidence of immunity (by a blood test).  You have  certain high-risk conditions, such as HIV or AIDS. Pregnant women should not get the VAR vaccine during pregnancy because it may be harmful to the unborn baby. However, if you are not immune to chickenpox (varicella), you should get a dose of the VAR vaccine within days after delivery. Human papillomavirus vaccine This is also known as the HPV vaccine. If you have not gotten the vaccine before or you missed doses in the past, talk to your health care provider about whether it is appropriate for you to get the HPV vaccine. Pneumococcal conjugate vaccine This is also known as the PCV13 vaccine. You should get the PCV13 vaccine as recommended if you have certain high-risk conditions. These include:  Diabetes.  Chronic conditions of the heart, lungs, or liver.  Conditions that affect the body's disease-fighting system (immune system). Pneumococcal polysaccharide vaccine This is also known as the PPSV23 vaccine. You should get the PPSV23 vaccine as recommended if you have certain high-risk conditions. These include:  Diabetes.  Chronic conditions of the heart, lungs, or liver.  Conditions that affect the immune system. Hepatitis A vaccine This is also known as the HepA vaccine. If you did not get the HepA vaccine previously, you should get it if:  You are at risk for a hepatitis A infection. You may be at risk for  infection if you: ? Have chronic liver disease. ? Have HIV or AIDS. ? Are a man who has sex with men. ? Use drugs. ? Are homeless. ? May be exposed to hepatitis A through work. ? Travel to countries where hepatitis A is common. ? Are pregnant. ? Have or will have close contact with someone who was adopted from another country.  You are not at risk for infection but want protection from hepatitis A. Hepatitis B vaccine This is also known as the HepB vaccine. If you did not get the HepB vaccine previously, you should get it if:  You are at risk for hepatitis B infection. You are at risk if you: ? Have chronic liver disease. ? Have HIV or AIDS. ? Have sex with a partner who has hepatitis B, or:  You have multiple sex partners.  You are a man who has sex with men. ? Use drugs. ? May be exposed to hepatitis B through work. ? Live with someone who has hepatitis B. ? Receive dialysis treatment. ? Have diabetes. ? Travel to countries where hepatitis B is common. ? Are pregnant.  You are not at risk of infection but want protection from hepatitis B. Meningococcal conjugate vaccine This is also known as the MenACWY vaccine. You may need to get the MenACWY vaccine if you:  Have not been given the vaccine before.  Need to catch up on doses you missed in the past. This vaccine is especially important if you:  Do not have a spleen.  Have sickle cell disease.  Have HIV.  Take medicines that suppress your immune system.  Travel to countries where meningococcal disease is common.  Are exposed to Neisseria meningitidis at work. Serogroup B meningococcal vaccine This is also known as the MenB vaccine. You may need to get the MenB vaccine if you:  Have not been given the vaccine before.  Need to catch up on doses you missed in the past. This vaccine is especially important if you:  Do not have a spleen.  Have sickle cell disease.  Take medicines that suppress your immune  system.  Are exposed to Neisseria meningitidis at work.  Haemophilus influenzae type b vaccine This is also known as the Hib vaccine. Anyone older than 42 years of age is usually not given the Hib vaccine. However, if you have certain high-risk conditions, you may need to get this vaccine. These conditions include:  Not having a spleen.  Having received a stem cell transplant. Before you get a vaccine: Talk with your health care provider about which vaccines are right for you. This is especially important if:  You previously had a reaction after getting a vaccine.  You have a weakened immune system. You may have a weakened immune system if you: ? Are taking medicines that reduce (suppress) the activity of your immune system. ? Are taking medicines to treat cancer (chemotherapy). ? Have HIV or AIDS.  You work in an environment where you may be exposed to a disease.  You plan to travel outside of the country.  You have a chronic illness, such as heart disease, kidney disease, diabetes, or lung disease.  You are pregnant, think you may be pregnant, or are planning to become pregnant. Summary  Before you get a vaccine, tell your health care provider if you have reacted to vaccines in the past or have a condition that weakens your immune system.  At 27-49 years, you should get a dose of the influenza vaccine every year and a dose of the Td or Tdap vaccine every 10 years.  Depending on your medical history and your risk factors, you may need other vaccines. Ask your health care provider whether you are up to date on all your vaccines.  Women who are pregnant may not receive certain vaccines. Ask your health care provider whether you should receive any vaccines soon after you deliver your baby. This information is not intended to replace advice given to you by your health care provider. Make sure you discuss any questions you have with your health care provider. Document Revised:  10/18/2018 Document Reviewed: 10/18/2018 Elsevier Patient Education  [place].

## 2019-09-24 ENCOUNTER — Other Ambulatory Visit: Payer: Self-pay | Admitting: Nurse Practitioner

## 2019-09-25 LAB — LIPID PANEL W/O CHOL/HDL RATIO
Cholesterol, Total: 165 mg/dL (ref 100–199)
HDL: 43 mg/dL (ref >39)
LDL Chol Calc (NIH): 102 mg/dL — ABNORMAL HIGH (ref 0–99)
Triglycerides: 107 mg/dL (ref 0–149)
VLDL Cholesterol Cal: 20 mg/dL (ref 5–40)

## 2019-09-25 LAB — GLUCOSE, RANDOM: Glucose: 92 mg/dL (ref 65–99)

## 2019-09-26 ENCOUNTER — Other Ambulatory Visit: Payer: Self-pay | Admitting: Nurse Practitioner

## 2020-02-29 ENCOUNTER — Other Ambulatory Visit: Payer: Self-pay

## 2020-02-29 DIAGNOSIS — J45909 Unspecified asthma, uncomplicated: Secondary | ICD-10-CM | POA: Insufficient documentation

## 2020-02-29 DIAGNOSIS — R102 Pelvic and perineal pain: Secondary | ICD-10-CM | POA: Diagnosis not present

## 2020-02-29 DIAGNOSIS — Z91040 Latex allergy status: Secondary | ICD-10-CM | POA: Insufficient documentation

## 2020-02-29 DIAGNOSIS — R112 Nausea with vomiting, unspecified: Secondary | ICD-10-CM | POA: Insufficient documentation

## 2020-02-29 DIAGNOSIS — R1030 Lower abdominal pain, unspecified: Secondary | ICD-10-CM | POA: Diagnosis present

## 2020-02-29 LAB — CBC WITH DIFFERENTIAL/PLATELET
Abs Immature Granulocytes: 0.03 10*3/uL (ref 0.00–0.07)
Basophils Absolute: 0 10*3/uL (ref 0.0–0.1)
Basophils Relative: 0 %
Eosinophils Absolute: 0.1 10*3/uL (ref 0.0–0.5)
Eosinophils Relative: 1 %
HCT: 38.6 % (ref 36.0–46.0)
Hemoglobin: 12.2 g/dL (ref 12.0–15.0)
Immature Granulocytes: 0 %
Lymphocytes Relative: 17 %
Lymphs Abs: 1.3 10*3/uL (ref 0.7–4.0)
MCH: 28.3 pg (ref 26.0–34.0)
MCHC: 31.6 g/dL (ref 30.0–36.0)
MCV: 89.6 fL (ref 80.0–100.0)
Monocytes Absolute: 0.3 10*3/uL (ref 0.1–1.0)
Monocytes Relative: 4 %
Neutro Abs: 6.1 10*3/uL (ref 1.7–7.7)
Neutrophils Relative %: 78 %
Platelets: 257 10*3/uL (ref 150–400)
RBC: 4.31 MIL/uL (ref 3.87–5.11)
RDW: 14.6 % (ref 11.5–15.5)
WBC: 7.9 10*3/uL (ref 4.0–10.5)
nRBC: 0 % (ref 0.0–0.2)

## 2020-02-29 MED ORDER — ONDANSETRON 4 MG PO TBDP
4.0000 mg | ORAL_TABLET | Freq: Once | ORAL | Status: AC
  Administered 2020-02-29: 4 mg via ORAL
  Filled 2020-02-29: qty 1

## 2020-02-29 NOTE — ED Triage Notes (Signed)
Diffuse abd pain and cramping. Pt reports on menstrual cycle with hx of PCOS and endometriosis. Reports pain and cramping are worse than with typical cycle. Emesis reported as well. Pt denies flow being heavier than normal for her with menstruation. Pt uncomfortable in triage but breathing unlabored and speaking in full sentences.

## 2020-03-01 ENCOUNTER — Emergency Department
Admission: EM | Admit: 2020-03-01 | Discharge: 2020-03-01 | Disposition: A | Discharge status: Home / Self Care | Payer: Managed Care, Other (non HMO) | Attending: Emergency Medicine | Admitting: Emergency Medicine

## 2020-03-01 DIAGNOSIS — R112 Nausea with vomiting, unspecified: Secondary | ICD-10-CM

## 2020-03-01 DIAGNOSIS — R102 Pelvic and perineal pain: Secondary | ICD-10-CM

## 2020-03-01 LAB — URINALYSIS, COMPLETE (UACMP) WITH MICROSCOPIC
Bacteria, UA: NONE SEEN
Bilirubin Urine: NEGATIVE
Glucose, UA: NEGATIVE mg/dL
Ketones, ur: 5 mg/dL — AB
Leukocytes,Ua: NEGATIVE
Nitrite: NEGATIVE
Protein, ur: 100 mg/dL — AB
RBC / HPF: >50 RBC/hpf — ABNORMAL HIGH (ref 0–5)
Specific Gravity, Urine: 1.028 (ref 1.005–1.030)
WBC, UA: NONE SEEN WBC/hpf (ref 0–5)
pH: 6 (ref 5.0–8.0)

## 2020-03-01 LAB — COMPREHENSIVE METABOLIC PANEL
ALT: 18 U/L (ref 0–44)
AST: 18 U/L (ref 15–41)
Albumin: 4.3 g/dL (ref 3.5–5.0)
Alkaline Phosphatase: 56 U/L (ref 38–126)
Anion gap: 7 (ref 5–15)
BUN: 13 mg/dL (ref 6–20)
CO2: 22 mmol/L (ref 22–32)
Calcium: 9 mg/dL (ref 8.9–10.3)
Chloride: 108 mmol/L (ref 98–111)
Creatinine, Ser: 0.71 mg/dL (ref 0.44–1.00)
GFR, Estimated: >60 mL/min (ref >60)
Glucose, Bld: 132 mg/dL — ABNORMAL HIGH (ref 70–99)
Potassium: 4 mmol/L (ref 3.5–5.1)
Sodium: 137 mmol/L (ref 135–145)
Total Bilirubin: 0.6 mg/dL (ref 0.3–1.2)
Total Protein: 8.4 g/dL — ABNORMAL HIGH (ref 6.5–8.1)

## 2020-03-01 LAB — POC URINE PREG, ED: Preg Test, Ur: NEGATIVE

## 2020-03-01 MED ORDER — ONDANSETRON HCL 4 MG/2ML IJ SOLN
4.0000 mg | INTRAMUSCULAR | Status: AC
  Administered 2020-03-01: 4 mg via INTRAVENOUS
  Filled 2020-03-01: qty 2

## 2020-03-01 MED ORDER — PROMETHAZINE HCL 25 MG RE SUPP: 25.0000 mg | Freq: Four times a day (QID) | RECTAL | 1 refills | Status: AC | PRN

## 2020-03-01 MED ORDER — KETOROLAC TROMETHAMINE 30 MG/ML IJ SOLN
15.0000 mg | Freq: Once | INTRAMUSCULAR | Status: AC
  Administered 2020-03-01: 15 mg via INTRAVENOUS
  Filled 2020-03-01: qty 1

## 2020-03-01 MED ORDER — LACTATED RINGERS IV BOLUS
1000.0000 mL | Freq: Once | INTRAVENOUS | Status: AC
  Administered 2020-03-01: 1000 mL via INTRAVENOUS

## 2020-03-01 MED ORDER — MORPHINE SULFATE (PF) 4 MG/ML IV SOLN
4.0000 mg | Freq: Once | INTRAVENOUS | Status: AC
  Administered 2020-03-01: 4 mg via INTRAVENOUS
  Filled 2020-03-01: qty 1

## 2020-03-01 NOTE — ED Provider Notes (Signed)
[facility] Emergency Department Provider Note  ____________________________________________   Event Date/Time   First MD Initiated Contact with Patient 03/01/20 0214     (approximate)  I have reviewed the triage vital signs and the nursing notes.   HISTORY  Chief Complaint Abdominal Pain    HPI Michele Hansen is a 45 y.o. female whose medical history includes abnormal uterine bleeding, endometriosis,.  She presents tonight for 1 to 2 days of generalized diffuse lower abdominal pain associated with nausea and vomiting.  She said it feels just like her usual pain associated with her menstrual cycle, which she is currently on, although it is more severe.  She tries hard not to come to the emergency department but said that she continued to vomit up her pain medicine and that her Zofran at home did not seem to help.  The pain comes in waves and is both sharp and aching but also frequently with severe cramps.  She has no concern about sexually transmitted diseases.  The pain does not localize on one side or the other.  She denies fever/chills, sore throat, chest pain, shortness of breath.  Nothing in particular is making the pain better or worse.  She is followed by an OB/GYN in [place] talks about medical therapy to help with her menstrual cycle pain that seems to be associated with PCOS and endometriosis.         Past Medical History:  Diagnosis Date  . Abnormal uterine bleeding (AUB)   . ADD (attention deficit disorder)   . Allergies   . AR (allergic rhinitis)   . Asthma   . Constipation   . Depression   . Endometriosis 2014  . Family history of breast cancer   . Fatigue   . Heavy periods   . Infertility, female   . Irregular periods   . Joint pain   . Knee MCL sprain   . Lactose intolerance   . Multiple food allergies    Bananas, Dates, Avocado, Citrus  . Painful menstrual periods   . Polycystic ovarian disease 2010  . Stress   . Vitamin D  deficiency   . Yeast infection     Patient Active Problem List   Diagnosis Date Noted  . Adenomyosis 08/20/2014  . Allergic state 08/20/2014  . Acute bronchospasm 08/20/2014  . Excessive and frequent menstruation with irregular cycle 08/20/2014  . Adiposity 06/04/2012  . Bilateral polycystic ovarian syndrome 06/04/2012  . Dysmenorrhea 01/20/2011    Past Surgical History:  Procedure Laterality Date  . BREAST CYST EXCISION Right 2015   [facility] Removed cyst.   . BREAST SURGERY Bilateral 2004   breast reduction  . cryotherapy    . CYSTECTOMY Right 2015   breast  . DILATION AND CURETTAGE OF UTERUS    . HYSTEROSCOPY WITH D & C N/A 05/13/2014   Procedure: DILATATION AND CURETTAGE /HYSTEROSCOPY;  Surgeon: Herold Harms, MD;  Location: [facility] ORS;  Service: Gynecology;  Laterality: N/A;  . MASS EXCISION N/A 2010   from uterus  . REDUCTION MAMMAPLASTY Bilateral 2004   Reduction     Prior to Admission medications   Medication Sig Start Date End Date Taking? Authorizing Provider  promethazine (PHENERGAN) 25 MG suppository Place 1 suppository (25 mg total) rectally every 6 (six) hours as needed for nausea. 03/01/20 03/01/21 Yes Loleta Rose, MD  acetaminophen (TYLENOL) 500 MG tablet Take 500 mg by mouth every 6 (six) hours as needed.    [provider]  buPROPion (WELLBUTRIN SR) 150 MG 12 hr tablet Take 1 tablet (150 mg total) by mouth daily. Patient not taking: Reported on 09/21/2019 06/13/18   Langston Reusing, MD  calcium carbonate (TUMS - DOSED IN MG ELEMENTAL CALCIUM) 500 MG chewable tablet Chew 1 tablet by mouth daily.    [provider]  cholecalciferol (VITAMIN D3) 25 MCG (1000 UT) tablet Take 1,000 Units by mouth daily.    [provider]  ibuprofen (ADVIL,MOTRIN) 800 MG tablet Take 800 mg by mouth every 8 (eight) hours as needed.    [provider]  metFORMIN (GLUCOPHAGE-XR) 500 MG 24 hr tablet Take 1 tablet (500 mg total) by mouth daily with  breakfast. 02/20/18   Quillian Quince D, MD  naproxen (NAPROSYN) 500 MG tablet Reported on 06/25/2015 Patient not taking: Reported on 09/21/2019 10/23/14   [provider]  Vitamin D, Ergocalciferol, (DRISDOL) 1.25 MG (50000 UT) CAPS capsule Take 1 capsule (50,000 Units total) by mouth every 7 (seven) days. Patient not taking: Reported on 09/21/2019 02/20/18   Quillian Quince D, MD  vitamin E 400 UNIT capsule Take 400 Units by mouth daily.    [provider]    Allergies Banana, Latex, Avocado, Citrus, Date seed extract [zizyphus jujuba], and Tape  Family History  Problem Relation Age of Onset  . Diabetes Father   . Colon cancer Father   . Obesity Father   . Breast cancer Mother   . Hyperlipidemia Mother   . Depression Maternal Grandfather   . Breast cancer Paternal Grandmother   . Heart disease Neg Hx   . Ovarian cancer Neg Hx     Social History Social History   Tobacco Use  . Smoking status: Never Smoker  . Smokeless tobacco: Never Used  Vaping Use  . Vaping Use: Never used  Substance Use Topics  . Alcohol use: Yes    Comment: occas  . Drug use: No    Review of Systems Constitutional: No fever/chills Eyes: No visual changes. ENT: No sore throat. Cardiovascular: Denies chest pain. Respiratory: Denies shortness of breath. Gastrointestinal: Diffuse lower abdominal/pelvic pain with nausea and vomiting. Genitourinary: Negative for dysuria. Musculoskeletal: Negative for neck pain.  Negative for back pain. Integumentary: Negative for rash. Neurological: Negative for headaches, focal weakness or numbness.   ____________________________________________   PHYSICAL EXAM:  VITAL SIGNS: ED Triage Vitals  Enc Vitals Group     BP 02/29/20 2323 (!) 125/104     Pulse Rate 02/29/20 2323 64     Resp 02/29/20 2323 20     Temp 02/29/20 2323 (!) 97.4 F (36.3 C)     Temp Source 02/29/20 2323 Oral     SpO2 02/29/20 2323 100 %     Weight 02/29/20 2323 132.9 kg  (293 lb)     Height 02/29/20 2323 1.727 m (5\' 8" )     Head Circumference --      Peak Flow --      Pain Score 02/29/20 2327 10     Pain Loc --      Pain Edu? --      Excl. in GC? --     Constitutional: Alert and oriented.  Eyes: Conjunctivae are normal.  Head: Atraumatic. Nose: No congestion/rhinnorhea. Mouth/Throat: Patient is wearing a mask. Neck: No stridor.  No meningeal signs.   Cardiovascular: Normal rate, regular rhythm. Good peripheral circulation. Respiratory: Normal respiratory effort.  No retractions. Gastrointestinal: Soft and nontender. No distention.  GU: Deferred due to patient  request and my agreement and understanding. Musculoskeletal: No lower extremity tenderness nor edema. No gross deformities of extremities. Neurologic:  Normal speech and language. No gross focal neurologic deficits are appreciated.  Skin:  Skin is warm, dry and intact. Psychiatric: Mood and affect are normal. Speech and behavior are normal.  ____________________________________________   LABS (all labs ordered are listed, but only abnormal results are displayed)  Labs Reviewed  URINALYSIS, COMPLETE (UACMP) WITH MICROSCOPIC - Abnormal; Notable for the following components:      Result Value   Color, Urine YELLOW (*)    APPearance HAZY (*)    Hgb urine dipstick LARGE (*)    Ketones, ur 5 (*)    Protein, ur 100 (*)    RBC / HPF >50 (*)    All other components within normal limits  COMPREHENSIVE METABOLIC PANEL - Abnormal; Notable for the following components:   Glucose, Bld 132 (*)    Total Protein 8.4 (*)    All other components within normal limits  CBC WITH DIFFERENTIAL/PLATELET  POC URINE PREG, ED   ____________________________________________  EKG  None - EKG not ordered by ED physician ____________________________________________  RADIOLOGY Marylou Mccoy, personally viewed and evaluated these images (plain radiographs) as part of my medical decision making, as well as  reviewing the written report by the radiologist.  ED MD interpretation:   Official radiology report(s): No results found.  ____________________________________________   PROCEDURES   Procedure(s) performed (including Critical Care):  Procedures   ____________________________________________   INITIAL IMPRESSION / MDM / ASSESSMENT AND PLAN / ED COURSE  As part of my medical decision making, I reviewed the following data within the electronic MEDICAL RECORD NUMBER Nursing notes reviewed and incorporated, Labs reviewed , Old chart reviewed, Notes from prior ED visits and [place] Controlled Substance Database   Differential diagnosis includes, but is not limited to, PCOS, endometriosis, ovarian cyst, ovarian torsion, STD/PID, SBO/ileus.  Patient's vital signs are stable.  She was in a great deal of discomfort when she first arrived and I was busy with a critical patient so I ordered Toradol 15 mg IV, morphine 4 mg IV, and Zofran 4 mg IV as well as 1 L lactated Ringer's.  When I evaluated the patient she felt much better.  She has no tenderness to palpation of her abdomen at this time.  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  We also discussed her reassuring lab work which shows some protein and blood in her urine which is likely secondary to her menstrual cycle and a normal comprehensive metabolic panel and CBC.  She is comfortable now and is ready to go home.  She has a provider with whom she can follow-up.  Given no abdominal distention or tenderness at this time I do not think she needs any advanced imaging.  Medication prescriptions as listed below.  She will call for follow-up tomorrow and she knows to return to the ED if she develops any new or worsening symptoms.  No indication of emergent medical condition at this time.           ____________________________________________  FINAL CLINICAL IMPRESSION(S) / ED DIAGNOSES  Final diagnoses:  Acute pain  in female pelvis  Nausea and vomiting, intractability of vomiting not specified, unspecified vomiting type     MEDICATIONS GIVEN DURING THIS VISIT:  Medications  ondansetron (ZOFRAN-ODT) disintegrating tablet 4 mg (4 mg Oral Given 02/29/20 2333)  lactated ringers bolus 1,000 mL (1,000 mLs Intravenous  New Bag/Given 03/01/20 0333)  morphine 4 MG/ML injection 4 mg (4 mg Intravenous Given 03/01/20 0334)  ondansetron (ZOFRAN) injection 4 mg (4 mg Intravenous Given 03/01/20 0334)  ketorolac (TORADOL) 30 MG/ML injection 15 mg (15 mg Intravenous Given 03/01/20 0333)     ED Discharge Orders         Ordered    promethazine (PHENERGAN) 25 MG suppository  Every 6 hours PRN        03/01/20 [id number]          *Please note:  Ashok NorrisMeteea J Kerwood was evaluated in Emergency Department on 03/01/2020 for the symptoms described in the history of present illness. She was evaluated in the context of the global COVID-19 pandemic, which necessitated consideration that the patient might be at risk for infection with the SARS-CoV-2 virus that causes COVID-19. Institutional protocols and algorithms that pertain to the evaluation of patients at risk for COVID-19 are in a state of rapid change based on information released by regulatory bodies including the CDC and federal and state organizations. These policies and algorithms were followed during the patient's care in the ED.  Some ED evaluations and interventions may be delayed as a result of limited staffing during and after the pandemic.*  Note:  This document was prepared using Dragon voice recognition software and may include unintentional dictation errors.   Loleta RoseForbach, Autry Prust, MD 03/01/20 [phone]

## 2020-03-01 NOTE — ED Notes (Signed)
Pt taken to subwait and placed in recliner chair for comfort.

## 2020-03-01 NOTE — ED Notes (Signed)
Lights dimmed and call bell in reach. Pt resting at this time.

## 2020-03-01 NOTE — Discharge Instructions (Signed)
Your workup in the Emergency Department today was reassuring.  We did not find any specific abnormalities.  We recommend you drink plenty of fluids, take your regular medications and/or any new ones prescribed today, and follow up with the doctor(s) listed in these documents as recommended.  Return to the Emergency Department if you develop new or worsening symptoms that concern you.  

## 2020-04-15 ENCOUNTER — Other Ambulatory Visit: Payer: Self-pay | Admitting: Obstetrics and Gynecology

## 2020-04-15 DIAGNOSIS — N946 Dysmenorrhea, unspecified: Secondary | ICD-10-CM

## 2020-04-16 ENCOUNTER — Ambulatory Visit
Admission: RE | Admit: 2020-04-16 | Discharge: 2020-04-16 | Disposition: A | Discharge status: Home / Self Care | Payer: Managed Care, Other (non HMO) | Source: Ambulatory Visit | Attending: Obstetrics and Gynecology | Admitting: Obstetrics and Gynecology

## 2020-04-16 DIAGNOSIS — N946 Dysmenorrhea, unspecified: Secondary | ICD-10-CM

## 2020-05-28 ENCOUNTER — Emergency Department: Payer: Managed Care, Other (non HMO)

## 2020-05-28 ENCOUNTER — Other Ambulatory Visit: Payer: Self-pay

## 2020-05-28 ENCOUNTER — Emergency Department
Admission: EM | Admit: 2020-05-28 | Discharge: 2020-05-28 | Disposition: A | Discharge status: Home / Self Care | Payer: Managed Care, Other (non HMO) | Attending: Emergency Medicine | Admitting: Emergency Medicine

## 2020-05-28 DIAGNOSIS — Z91040 Latex allergy status: Secondary | ICD-10-CM | POA: Diagnosis not present

## 2020-05-28 DIAGNOSIS — R102 Pelvic and perineal pain: Secondary | ICD-10-CM

## 2020-05-28 DIAGNOSIS — J45909 Unspecified asthma, uncomplicated: Secondary | ICD-10-CM | POA: Insufficient documentation

## 2020-05-28 DIAGNOSIS — N809 Endometriosis, unspecified: Secondary | ICD-10-CM

## 2020-05-28 DIAGNOSIS — N8003 Adenomyosis of the uterus: Secondary | ICD-10-CM

## 2020-05-28 DIAGNOSIS — N800 Endometriosis of uterus: Secondary | ICD-10-CM | POA: Insufficient documentation

## 2020-05-28 DIAGNOSIS — R1030 Lower abdominal pain, unspecified: Secondary | ICD-10-CM | POA: Diagnosis present

## 2020-05-28 LAB — HEPATIC FUNCTION PANEL
ALT: 16 U/L (ref 0–44)
AST: 11 U/L — ABNORMAL LOW (ref 15–41)
Albumin: 3.7 g/dL (ref 3.5–5.0)
Alkaline Phosphatase: 36 U/L — ABNORMAL LOW (ref 38–126)
Bilirubin, Direct: 0.1 mg/dL (ref 0.0–0.2)
Indirect Bilirubin: 0.7 mg/dL (ref 0.3–0.9)
Total Bilirubin: 0.8 mg/dL (ref 0.3–1.2)
Total Protein: 7.7 g/dL (ref 6.5–8.1)

## 2020-05-28 LAB — BASIC METABOLIC PANEL
Anion gap: 11 (ref 5–15)
BUN: 19 mg/dL (ref 6–20)
CO2: 18 mmol/L — ABNORMAL LOW (ref 22–32)
Calcium: 8.7 mg/dL — ABNORMAL LOW (ref 8.9–10.3)
Chloride: 106 mmol/L (ref 98–111)
Creatinine, Ser: 0.77 mg/dL (ref 0.44–1.00)
GFR, Estimated: >60 mL/min (ref >60)
Glucose, Bld: 90 mg/dL (ref 70–99)
Potassium: 3.9 mmol/L (ref 3.5–5.1)
Sodium: 135 mmol/L (ref 135–145)

## 2020-05-28 LAB — CBC
HCT: 38.6 % (ref 36.0–46.0)
Hemoglobin: 12.7 g/dL (ref 12.0–15.0)
MCH: 28 pg (ref 26.0–34.0)
MCHC: 32.9 g/dL (ref 30.0–36.0)
MCV: 85.2 fL (ref 80.0–100.0)
Platelets: 276 10*3/uL (ref 150–400)
RBC: 4.53 MIL/uL (ref 3.87–5.11)
RDW: 14.6 % (ref 11.5–15.5)
WBC: 9 10*3/uL (ref 4.0–10.5)
nRBC: 0 % (ref 0.0–0.2)

## 2020-05-28 LAB — URINALYSIS, COMPLETE (UACMP) WITH MICROSCOPIC
Bilirubin Urine: NEGATIVE
Glucose, UA: NEGATIVE mg/dL
Ketones, ur: 20 mg/dL — AB
Leukocytes,Ua: NEGATIVE
Nitrite: NEGATIVE
Protein, ur: 100 mg/dL — AB
RBC / HPF: >50 RBC/hpf — ABNORMAL HIGH (ref 0–5)
Specific Gravity, Urine: 1.034 — ABNORMAL HIGH (ref 1.005–1.030)
pH: 6 (ref 5.0–8.0)

## 2020-05-28 LAB — POC URINE PREG, ED: Preg Test, Ur: NEGATIVE

## 2020-05-28 LAB — LIPASE, BLOOD: Lipase: 23 U/L (ref 11–51)

## 2020-05-28 MED ORDER — KETOROLAC TROMETHAMINE 30 MG/ML IJ SOLN
15.0000 mg | Freq: Once | INTRAMUSCULAR | Status: AC
  Administered 2020-05-28: 15 mg via INTRAVENOUS
  Filled 2020-05-28: qty 1

## 2020-05-28 MED ORDER — IBUPROFEN 600 MG PO TABS: 600.0000 mg | ORAL_TABLET | Freq: Three times a day (TID) | ORAL | 0 refills | Status: AC | PRN

## 2020-05-28 MED ORDER — ONDANSETRON HCL 4 MG/2ML IJ SOLN
4.0000 mg | Freq: Once | INTRAMUSCULAR | Status: AC
  Administered 2020-05-28: 4 mg via INTRAVENOUS
  Filled 2020-05-28: qty 2

## 2020-05-28 MED ORDER — HYDROMORPHONE HCL 1 MG/ML IJ SOLN
1.0000 mg | Freq: Once | INTRAMUSCULAR | Status: AC
  Administered 2020-05-28: 1 mg via INTRAVENOUS
  Filled 2020-05-28: qty 1

## 2020-05-28 MED ORDER — ONDANSETRON 4 MG PO TBDP: 4.0000 mg | ORAL_TABLET | Freq: Three times a day (TID) | ORAL | 0 refills | Status: DC | PRN

## 2020-05-28 MED ORDER — OXYCODONE-ACETAMINOPHEN 5-325 MG PO TABS: 1.0000 | ORAL_TABLET | Freq: Four times a day (QID) | ORAL | 0 refills | Status: DC | PRN

## 2020-05-28 MED ORDER — LACTATED RINGERS IV BOLUS
1000.0000 mL | Freq: Once | INTRAVENOUS | Status: AC
  Administered 2020-05-28: 1000 mL via INTRAVENOUS

## 2020-05-28 NOTE — ED Notes (Signed)
Pt attempted to void for urine sample for UA and POC preg. Pt unable to void at this time.

## 2020-05-28 NOTE — ED Notes (Signed)
EDP at bedside with pt.  

## 2020-05-28 NOTE — ED Notes (Signed)
Pt back from us

## 2020-05-28 NOTE — ED Notes (Signed)
EDP at bedside.  Pt states she also has hx PCOS and endometriosis and is on period.

## 2020-05-28 NOTE — ED Triage Notes (Signed)
Pt to ED L flank pain sharp stabbing radiates to back 10/10 Pt states is clammy and dizzy Pt is nauseous, vomiting (1 episode yesterday, 1 episode today) and tried zofran but still vomited Denies pain, burning with urination No hx kidney stones

## 2020-05-28 NOTE — ED Provider Notes (Signed)
[facility] ([facility]) Emergency Department Provider Note  ____________________________________________   Event Date/Time   First MD Initiated Contact with Patient 05/28/20 1114     (approximate)  I have reviewed the triage vital signs and the nursing notes.   HISTORY  Chief Complaint Flank Pain    HPI Michele Hansen is a 45 y.o. female with history of adenomyosis, recurrent pelvic pain, here with lower abdominal pain.  The patient states that earlier today, she developed acute onset of severe, aching, stabbing, sharp, cramp-like, lower abdominal pain.  This pain began fairly acutely.  She has had some radiation to her back.  She has had nausea and vomiting due to the severity of pain.  She has history of endometriosis and chronic pain related to her periods, but has been taking her OCPs.  Pain is worse with movement and palpation.  No alleviating factors.  No urinary symptoms.  No fevers.        Past Medical History:  Diagnosis Date  . Abnormal uterine bleeding (AUB)   . ADD (attention deficit disorder)   . Allergies   . AR (allergic rhinitis)   . Asthma   . Constipation   . Depression   . Endometriosis 2014  . Family history of breast cancer   . Fatigue   . Heavy periods   . Infertility, female   . Irregular periods   . Joint pain   . Knee MCL sprain   . Lactose intolerance   . Multiple food allergies    Bananas, Dates, Avocado, Citrus  . Painful menstrual periods   . Polycystic ovarian disease 2010  . Stress   . Vitamin D deficiency   . Yeast infection     Patient Active Problem List   Diagnosis Date Noted  . Adenomyosis 08/20/2014  . Allergic state 08/20/2014  . Acute bronchospasm 08/20/2014  . Excessive and frequent menstruation with irregular cycle 08/20/2014  . Adiposity 06/04/2012  . Bilateral polycystic ovarian syndrome 06/04/2012  . Dysmenorrhea 01/20/2011    Past Surgical History:  Procedure Laterality Date  . BREAST CYST  EXCISION Right 2015   [facility] Removed cyst.   . BREAST SURGERY Bilateral 2004   breast reduction  . cryotherapy    . CYSTECTOMY Right 2015   breast  . DILATION AND CURETTAGE OF UTERUS    . HYSTEROSCOPY WITH D & C N/A 05/13/2014   Procedure: DILATATION AND CURETTAGE /HYSTEROSCOPY;  Surgeon: Herold Harms, MD;  Location: [facility] ORS;  Service: Gynecology;  Laterality: N/A;  . MASS EXCISION N/A 2010   from uterus  . REDUCTION MAMMAPLASTY Bilateral 2004   Reduction     Prior to Admission medications   Medication Sig Start Date End Date Taking? Authorizing Provider  ibuprofen (ADVIL) 600 MG tablet Take 1 tablet (600 mg total) by mouth every 8 (eight) hours as needed for moderate pain or cramping. 05/28/20  Yes Shaune Pollack, MD  ondansetron (ZOFRAN ODT) 4 MG disintegrating tablet Take 1 tablet (4 mg total) by mouth every 8 (eight) hours as needed for nausea or vomiting. 05/28/20  Yes Shaune Pollack, MD  oxyCODONE-acetaminophen (PERCOCET) 5-325 MG tablet Take 1-2 tablets by mouth every 6 (six) hours as needed for severe pain (no more than 6 tabs daily). 05/28/20 05/28/21 Yes Shaune Pollack, MD  acetaminophen (TYLENOL) 500 MG tablet Take 500 mg by mouth every 6 (six) hours as needed.    [provider]  buPROPion (WELLBUTRIN SR) 150 MG 12 hr tablet Take  1 tablet (150 mg total) by mouth daily. Patient not taking: Reported on 09/21/2019 06/13/18   Langston ReusingUkleja, Alexandria U, MD  calcium carbonate (TUMS - DOSED IN MG ELEMENTAL CALCIUM) 500 MG chewable tablet Chew 1 tablet by mouth daily.    [provider]  cholecalciferol (VITAMIN D3) 25 MCG (1000 UT) tablet Take 1,000 Units by mouth daily.    [provider]  metFORMIN (GLUCOPHAGE-XR) 500 MG 24 hr tablet Take 1 tablet (500 mg total) by mouth daily with breakfast. 02/20/18   Quillian QuinceBeasley, Caren D, MD  naproxen (NAPROSYN) 500 MG tablet Reported on 06/25/2015 Patient not taking: Reported on 09/21/2019 10/23/14   [provider]   promethazine (PHENERGAN) 25 MG suppository Place 1 suppository (25 mg total) rectally every 6 (six) hours as needed for nausea. 03/01/20 03/01/21  Loleta RoseForbach, Cory, MD  Vitamin D, Ergocalciferol, (DRISDOL) 1.25 MG (50000 UT) CAPS capsule Take 1 capsule (50,000 Units total) by mouth every 7 (seven) days. Patient not taking: Reported on 09/21/2019 02/20/18   Quillian QuinceBeasley, Caren D, MD  vitamin E 400 UNIT capsule Take 400 Units by mouth daily.    [provider]    Allergies Banana, Latex, Avocado, Citrus, Date seed extract [zizyphus jujuba], and Tape  Family History  Problem Relation Age of Onset  . Diabetes Father   . Colon cancer Father   . Obesity Father   . Breast cancer Mother   . Hyperlipidemia Mother   . Depression Maternal Grandfather   . Breast cancer Paternal Grandmother   . Heart disease Neg Hx   . Ovarian cancer Neg Hx     Social History Social History   Tobacco Use  . Smoking status: Never Smoker  . Smokeless tobacco: Never Used  Vaping Use  . Vaping Use: Never used  Substance Use Topics  . Alcohol use: Yes    Comment: occas  . Drug use: No    Review of Systems  Review of Systems  Constitutional: Negative for fever.  HENT: Negative for congestion and sore throat.   Eyes: Negative for visual disturbance.  Respiratory: Negative for cough and shortness of breath.   Cardiovascular: Negative for chest pain.  Gastrointestinal: Positive for abdominal pain, nausea and vomiting. Negative for diarrhea.  Genitourinary: Positive for pelvic pain and vaginal bleeding. Negative for flank pain.  Musculoskeletal: Negative for back pain and neck pain.  Skin: Negative for rash and wound.  All other systems reviewed and are negative.    ____________________________________________  PHYSICAL EXAM:      VITAL SIGNS: ED Triage Vitals  Enc Vitals Group     BP 05/28/20 1118 135/85     Pulse Rate 05/28/20 1118 63     Resp 05/28/20 1118 (!) 24     Temp 05/28/20 1118 97.7 F  (36.5 C)     Temp Source 05/28/20 1118 Oral     SpO2 05/28/20 1118 100 %     Weight 05/28/20 1113 283 lb (128.4 kg)     Height 05/28/20 1113 5\' 8"  (1.727 m)     Head Circumference --      Peak Flow --      Pain Score 05/28/20 1113 10     Pain Loc --      Pain Edu? --      Excl. in GC? --      Physical Exam Vitals and nursing note reviewed.  Constitutional:      General: She is not in acute distress.    Appearance: She  is well-developed.  HENT:     Head: Normocephalic and atraumatic.  Eyes:     Conjunctiva/sclera: Conjunctivae normal.  Cardiovascular:     Rate and Rhythm: Normal rate and regular rhythm.     Heart sounds: Normal heart sounds. No murmur heard. No friction rub.  Pulmonary:     Effort: Pulmonary effort is normal. No respiratory distress.     Breath sounds: Normal breath sounds. No wheezing or rales.  Abdominal:     General: There is no distension.     Palpations: Abdomen is soft.     Tenderness: There is abdominal tenderness in the suprapubic area. There is guarding.  Musculoskeletal:     Cervical back: Neck supple.  Skin:    General: Skin is warm.     Capillary Refill: Capillary refill takes less than 2 seconds.  Neurological:     Mental Status: She is alert and oriented to person, place, and time.     Motor: No abnormal muscle tone.       ____________________________________________   LABS (all labs ordered are listed, but only abnormal results are displayed)  Labs Reviewed  URINALYSIS, COMPLETE (UACMP) WITH MICROSCOPIC - Abnormal; Notable for the following components:      Result Value   Color, Urine YELLOW (*)    APPearance HAZY (*)    Specific Gravity, Urine 1.034 (*)    Hgb urine dipstick LARGE (*)    Ketones, ur 20 (*)    Protein, ur 100 (*)    RBC / HPF >50 (*)    Bacteria, UA RARE (*)    All other components within normal limits  BASIC METABOLIC PANEL - Abnormal; Notable for the following components:   CO2 18 (*)    Calcium 8.7  (*)    All other components within normal limits  HEPATIC FUNCTION PANEL - Abnormal; Notable for the following components:   AST 11 (*)    Alkaline Phosphatase 36 (*)    All other components within normal limits  CBC  LIPASE, BLOOD  POC URINE PREG, ED    ____________________________________________  EKG:  ________________________________________  RADIOLOGY All imaging, including plain films, CT scans, and ultrasounds, independently reviewed by me, and interpretations confirmed via formal radiology reads.  ED MD interpretation:   Ultrasound: Enlarged, heterogenous uterus consistent with adenomyosis, endometrial thickening, moderate volume free fluid  Official radiology report(s): US PELVIC COMPLETE W TRANSVAGINAL AND TORSION R/O  Result Date: 05/28/2020 CLINICAL DATA:  Pelvic pain for 3 days, menorrhagia. History of hysteroscopy with dilation and curettage in 2016. EXAM: TRANSABDOMINAL AND TRANSVAGINAL ULTRASOUND OF PELVIS DOPPLER ULTRASOUND OF OVARIES TECHNIQUE: Both transabdominal and transvaginal ultrasound examinations of the pelvis were performed. Transabdominal technique was performed for global imaging of the pelvis including uterus, ovaries, adnexal regions, and pelvic cul-de-sac. It was necessary to proceed with endovaginal exam following the transabdominal exam to visualize the endometrium and left ovary. Color and duplex Doppler ultrasound was utilized to evaluate blood flow to the ovaries. COMPARISON:  Pelvic ultrasound 04/16/2020 FINDINGS: Uterus Measurements: 9.2 x 6.5 x 7.2 cm = volume: 225.8 mL. Anteverted uterus. Diffusely thickened and heterogeneous myometrium including patchy punctate areas of hyperechogenicity and posterior acoustic shadowing in a "[place] blind" pattern. Endometrium Thickness: 20.3 mm, likely thickened though margins are quite ill-defined and may be difficult to accurately ascertain. Right ovary Measurements: 3 x 2 x 1.8 cm = volume: 5.7 mL. Normal  appearance without concerning right adnexal lesion. Left ovary Not well visualized on transabdominal or transvaginal  imaging. Pulsed Doppler evaluation of both ovaries demonstrates normal low-resistance arterial and venous waveforms. Other findings Moderate volume of anechoic fluid in the deep pelvis. IMPRESSION: Enlarged, heterogeneous appearance of the uterus, particularly the myometrium with a poorly defined endometrial stripe, scattered myometrial echogenic foci and posterior shadowing in a [place] blind pattern. Appearance is highly conspicuous for adenomyosis. Possible endometrial thickening at 20 mm. If bleeding remains unresponsive to hormonal or medical therapy, focal lesion work-up with sonohysterogram should be considered. Endometrial biopsy should also be considered in pre-menopausal patients at high risk for endometrial carcinoma. (Ref: Radiological Reasoning: Algorithmic Workup of Abnormal Vaginal Bleeding with Endovaginal Sonography and Sonohysterography. AJR 2008; 161:W96-04) Right ovary is unremarkable.  Nonvisualization of the left ovary. Moderate volume anechoic free fluid in the deep pelvis, nonspecific though somewhat greater than expected for physiologic levels. Electronically Signed   By: Kreg Shropshire M.D.   On: 05/28/2020 15:00    ____________________________________________  PROCEDURES   Procedure(s) performed (including Critical Care):  Procedures  ____________________________________________  INITIAL IMPRESSION / MDM / ASSESSMENT AND PLAN / ED COURSE  As part of my medical decision making, I reviewed the following data within the electronic MEDICAL RECORD NUMBER Nursing notes reviewed and incorporated, Old chart reviewed, Notes from prior ED visits, and [place] Controlled Substance Database       *Michele Hansen was evaluated in Emergency Department on 05/28/2020 for the symptoms described in the history of present illness. She was evaluated in the context of the global  COVID-19 pandemic, which necessitated consideration that the patient might be at risk for infection with the SARS-CoV-2 virus that causes COVID-19. Institutional protocols and algorithms that pertain to the evaluation of patients at risk for COVID-19 are in a state of rapid change based on information released by regulatory bodies including the CDC and federal and state organizations. These policies and algorithms were followed during the patient's care in the ED.  Some ED evaluations and interventions may be delayed as a result of limited staffing during the pandemic.*     Medical Decision Making: 45 year old female here with lower pelvic/abdominal pain. History, exam is consistent with recurrent adenomyosis and endometriosis. Hgb is stable. LFTs, lipase wnl and pt has no other abd TTP to suggest cholecystitis, appendicitis, obstruction, diverticulitis. UA c/w dehydration from vomiting and pt was given fluids. Hematuria likely 2/2 her menstruation. Pelvic u/s obtained, is c/w likely adenomyosis and thickened em likely from her bleeding. She is HDS. Denies concerns for STI/PID. Will tx with analgesia, refer her to [facility] for ongoing management. Feels better in ED, tolerating PO, and otherwise nontoxic and HDS.  ____________________________________________  FINAL CLINICAL IMPRESSION(S) / ED DIAGNOSES  Final diagnoses:  Pelvic pain  Adenomyosis  Endometriosis     MEDICATIONS GIVEN DURING THIS VISIT:  Medications  HYDROmorphone (DILAUDID) injection 1 mg (1 mg Intravenous Given 05/28/20 1134)  ondansetron (ZOFRAN) injection 4 mg (4 mg Intravenous Given 05/28/20 1134)  lactated ringers bolus 1,000 mL (0 mLs Intravenous Stopped 05/28/20 1620)  HYDROmorphone (DILAUDID) injection 1 mg (1 mg Intravenous Given 05/28/20 1224)  ketorolac (TORADOL) 30 MG/ML injection 15 mg (15 mg Intravenous Given 05/28/20 1223)  ketorolac (TORADOL) 30 MG/ML injection 15 mg (15 mg Intravenous Given 05/28/20 1620)  HYDROmorphone  (DILAUDID) injection 1 mg (1 mg Intravenous Given 05/28/20 1619)  ondansetron (ZOFRAN) injection 4 mg (4 mg Intravenous Given 05/28/20 1620)     ED Discharge Orders         Ordered    oxyCODONE-acetaminophen (PERCOCET) 5-325  MG tablet  Every 6 hours PRN        05/28/20 1541    ibuprofen (ADVIL) 600 MG tablet  Every 8 hours PRN        05/28/20 1541    ondansetron (ZOFRAN ODT) 4 MG disintegrating tablet  Every 8 hours PRN        05/28/20 1541           Note:  This document was prepared using Dragon voice recognition software and may include unintentional dictation errors.   Shaune Pollack, MD 05/28/20 [phone]

## 2020-08-13 ENCOUNTER — Other Ambulatory Visit: Payer: Self-pay

## 2020-08-13 ENCOUNTER — Ambulatory Visit
Admission: RE | Admit: 2020-08-13 | Discharge: 2020-08-13 | Disposition: A | Discharge status: Home / Self Care | Payer: Managed Care, Other (non HMO) | Source: Ambulatory Visit | Attending: Emergency Medicine | Admitting: Emergency Medicine

## 2020-08-13 ENCOUNTER — Ambulatory Visit (INDEPENDENT_AMBULATORY_CARE_PROVIDER_SITE_OTHER): Payer: Managed Care, Other (non HMO)

## 2020-08-13 VITALS — BP 124/89 | HR 80 | Temp 98.0°F | Resp 18

## 2020-08-13 DIAGNOSIS — S46002A Unspecified injury of muscle(s) and tendon(s) of the rotator cuff of left shoulder, initial encounter: Secondary | ICD-10-CM

## 2020-08-13 DIAGNOSIS — M25512 Pain in left shoulder: Secondary | ICD-10-CM

## 2020-08-13 MED ORDER — METHOCARBAMOL 500 MG PO TABS: 500.0000 mg | ORAL_TABLET | Freq: Two times a day (BID) | ORAL | 0 refills | Status: AC

## 2020-08-13 NOTE — ED Triage Notes (Signed)
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 Soreness and pain shoots down deltoid and into top of bicep. ROM is limited.

## 2020-08-13 NOTE — Discharge Instructions (Addendum)
Follow up with PCP to inform of visit and treatment in clinic today as orthopedic or PT referral may be needed if symptoms persist longer than 2-3 weeks.  Increase fluid intake. Apply ice to affected area 3-5 times daily for 15-20 minute intervals. Take methocarbamol as prescribed. May take 1000 mg Tylenol  OR 600 mg ibuprofen every 8 hours as needed for pain as long as neither medication is contraindicated to your current health conditions. Return to clinic if symptoms worsen. If you experience shortness of breath, chest pain, dizziness, fainting or severe headache go to the ER.

## 2020-08-13 NOTE — ED Provider Notes (Signed)
Chief Complaint   Chief Complaint  Patient presents with   Shoulder Pain     Subjective, HPI  Michele Hansen is a 45 y.o. 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  Patient reports straining the left shoulder.  She reports soreness and pain.  She reports that the pain at times will shoot to the left deltoid on the top of her biceps area.  Patient reports limited range of motion with active range of motion, but is able to perform passive range of motion.  She does not report any additional trauma or falls precipitating current symptoms.  History obtained from patient.  Patient's problem list, past medical and social history, medications, and allergies were reviewed by me and updated in Epic.    ROS  See HPI.  Objective   Vitals:   08/13/20 0904  BP: 124/89  Pulse: 80  Resp: 18  Temp: 98 F (36.7 C)  SpO2: 98%    Vital signs and nursing note reviewed.   General: Appears well-developed and well-nourished. No acute distress.  Head: Normocephalic and atraumatic.   Neck: Normal range of motion, neck is supple.  Cardiovascular: Normal rate. Pulm/Chest: No respiratory distress.  Musculoskeletal: L Shoulder: Mild TTP noted diffusely about left shoulder musculature.  Patient retains about 50% of active range of motion with flexion and extension.  She is able to retain about 70% of passive range of motion to left shoulder.  5/5 strength, full sensation. Neurological: Alert and oriented to person, place, and time.  Skin: Skin is warm and dry.   Psychiatric: Normal mood, affect, behavior, and thought content.    Data  No results found for any visits on 08/13/20.   Imaging L Shoulder: On my read, some mild degenerative changes noted, no acute fracture or dislocation noted. Pending final interpretation.   Assessment & Plan  1. Injury of left rotator cuff, initial encounter Meds ordered this encounter  Medications    methocarbamol (ROBAXIN) 500 MG tablet    Sig: Take 1 tablet (500 mg total) by mouth 2 (two) times daily.    Dispense:  20 tablet    Refill:  0    Order Specific Question:   Supervising Provider    Answer:   Merrilee Jansky [[id number]]    45 y.o. 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  Patient reports straining the left shoulder.  She reports soreness and pain.  She reports that the pain at times will shoot to the left deltoid on the top of her biceps area.  Patient reports limited range of motion with active range of motion, but is able to perform passive range of motion.  She does not report any additional trauma or falls precipitating current symptoms.  Chart review completed.  Left shoulder x-ray shows some mild degenerative changes without any acute fracture or dislocation.  Given symptoms and presentation in clinic, likely left rotator cuff injury.  Did advise the patient to complete range of motion exercises at home as tolerated and provided with her AVS.  This should include pendulum swings.  Rx methocarbamol to the patient's preferred pharmacy and advised use of ice several times daily to the left shoulder region.  Did advise that if her symptoms persist for longer than 2 to 3 weeks she will need to follow-up with her primary care as they may refer her to physical therapy or orthopedics for further investigation of  her injury.  Return as needed.  Stable on discharge.  Patient verbalized understanding and agreed with plan.  Plan:   Discharge Instructions      Follow up with PCP to inform of visit and treatment in clinic today as orthopedic or PT referral may be needed if symptoms persist longer than 2-3 weeks.  Increase fluid intake. Apply ice to affected area 3-5 times daily for 15-20 minute intervals. Take methocarbamol as prescribed. May take 1000 mg Tylenol  OR 600 mg ibuprofen every 8 hours as needed for pain as long as neither  medication is contraindicated to your current health conditions. Return to clinic if symptoms worsen. If you experience shortness of breath, chest pain, dizziness, fainting or severe headache go to the ER.          Amalia Greenhouse, FNP 08/13/20 1004

## 2020-08-15 ENCOUNTER — Encounter: Payer: Self-pay | Admitting: Emergency Medicine

## 2020-08-15 ENCOUNTER — Emergency Department
Admission: EM | Admit: 2020-08-15 | Discharge: 2020-08-15 | Disposition: A | Discharge status: Home / Self Care | Payer: Managed Care, Other (non HMO) | Attending: Emergency Medicine | Admitting: Emergency Medicine

## 2020-08-15 ENCOUNTER — Other Ambulatory Visit: Payer: Self-pay

## 2020-08-15 ENCOUNTER — Emergency Department: Payer: Managed Care, Other (non HMO)

## 2020-08-15 DIAGNOSIS — M549 Dorsalgia, unspecified: Secondary | ICD-10-CM | POA: Insufficient documentation

## 2020-08-15 DIAGNOSIS — R109 Unspecified abdominal pain: Secondary | ICD-10-CM

## 2020-08-15 DIAGNOSIS — N9489 Other specified conditions associated with female genital organs and menstrual cycle: Secondary | ICD-10-CM | POA: Insufficient documentation

## 2020-08-15 DIAGNOSIS — R1032 Left lower quadrant pain: Secondary | ICD-10-CM | POA: Insufficient documentation

## 2020-08-15 DIAGNOSIS — Z91040 Latex allergy status: Secondary | ICD-10-CM | POA: Diagnosis not present

## 2020-08-15 DIAGNOSIS — J45909 Unspecified asthma, uncomplicated: Secondary | ICD-10-CM | POA: Insufficient documentation

## 2020-08-15 DIAGNOSIS — R112 Nausea with vomiting, unspecified: Secondary | ICD-10-CM | POA: Diagnosis not present

## 2020-08-15 LAB — COMPREHENSIVE METABOLIC PANEL
ALT: 12 U/L (ref 0–44)
AST: 17 U/L (ref 15–41)
Albumin: 3.9 g/dL (ref 3.5–5.0)
Alkaline Phosphatase: 45 U/L (ref 38–126)
Anion gap: 11 (ref 5–15)
BUN: 11 mg/dL (ref 6–20)
CO2: 21 mmol/L — ABNORMAL LOW (ref 22–32)
Calcium: 9.1 mg/dL (ref 8.9–10.3)
Chloride: 106 mmol/L (ref 98–111)
Creatinine, Ser: 0.79 mg/dL (ref 0.44–1.00)
GFR, Estimated: >60 mL/min (ref >60)
Glucose, Bld: 98 mg/dL (ref 70–99)
Potassium: 4 mmol/L (ref 3.5–5.1)
Sodium: 138 mmol/L (ref 135–145)
Total Bilirubin: 1.1 mg/dL (ref 0.3–1.2)
Total Protein: 7.5 g/dL (ref 6.5–8.1)

## 2020-08-15 LAB — CBC
HCT: 40.4 % (ref 36.0–46.0)
Hemoglobin: 13.2 g/dL (ref 12.0–15.0)
MCH: 28.6 pg (ref 26.0–34.0)
MCHC: 32.7 g/dL (ref 30.0–36.0)
MCV: 87.6 fL (ref 80.0–100.0)
Platelets: UNDETERMINED 10*3/uL (ref 150–400)
RBC: 4.61 MIL/uL (ref 3.87–5.11)
RDW: 14.8 % (ref 11.5–15.5)
WBC: 8 10*3/uL (ref 4.0–10.5)
nRBC: 0 % (ref 0.0–0.2)

## 2020-08-15 LAB — HCG, QUANTITATIVE, PREGNANCY: hCG, Beta Chain, Quant, S: <1 m[IU]/mL (ref <5)

## 2020-08-15 LAB — LIPASE, BLOOD: Lipase: 22 U/L (ref 11–51)

## 2020-08-15 MED ORDER — MORPHINE SULFATE (PF) 4 MG/ML IV SOLN
4.0000 mg | Freq: Once | INTRAVENOUS | Status: AC
  Administered 2020-08-15: 4 mg via INTRAVENOUS
  Filled 2020-08-15: qty 1

## 2020-08-15 MED ORDER — IOHEXOL 300 MG/ML  SOLN
100.0000 mL | Freq: Once | INTRAMUSCULAR | Status: DC | PRN
  Filled 2020-08-15: qty 100

## 2020-08-15 MED ORDER — HYDROCODONE-ACETAMINOPHEN 5-325 MG PO TABS: 1.0000 | ORAL_TABLET | Freq: Four times a day (QID) | ORAL | 0 refills | Status: AC | PRN

## 2020-08-15 MED ORDER — IOHEXOL 350 MG/ML SOLN
100.0000 mL | Freq: Once | INTRAVENOUS | Status: AC | PRN
  Administered 2020-08-15: 100 mL via INTRAVENOUS
  Filled 2020-08-15: qty 100

## 2020-08-15 MED ORDER — ONDANSETRON 4 MG PO TBDP
4.0000 mg | ORAL_TABLET | Freq: Once | ORAL | Status: AC
  Administered 2020-08-15: 4 mg via ORAL
  Filled 2020-08-15: qty 1

## 2020-08-15 MED ORDER — ONDANSETRON 4 MG PO TBDP: 4.0000 mg | ORAL_TABLET | Freq: Three times a day (TID) | ORAL | 0 refills | Status: AC | PRN

## 2020-08-15 MED ORDER — METOCLOPRAMIDE HCL 5 MG/ML IJ SOLN
10.0000 mg | Freq: Once | INTRAMUSCULAR | Status: AC
  Administered 2020-08-15: 10 mg via INTRAVENOUS
  Filled 2020-08-15: qty 2

## 2020-08-15 MED ORDER — SODIUM CHLORIDE 0.9 % IV BOLUS
1000.0000 mL | Freq: Once | INTRAVENOUS | Status: AC
  Administered 2020-08-15: 1000 mL via INTRAVENOUS

## 2020-08-15 NOTE — ED Notes (Signed)
See triage note  Presents with severe abd cramping  States she has a hx of endometriosis and usually has harsh cramping    States this pain is lower back and pelvic area  Positive nausea

## 2020-08-15 NOTE — ED Triage Notes (Signed)
Pt also reports that she has percocet, a muscle relaxer and zofran but they are not helping and has come for this before.

## 2020-08-15 NOTE — Discharge Instructions (Addendum)
Follow up with your regular doctor if not improving in 3 days Return to the ER if worsening Take the pain medication as needed

## 2020-08-15 NOTE — ED Triage Notes (Signed)
Pt reports that she is on her period and is having cramps in her pelvic and back area. Pt moaning in triage but answers questions in between. She states that she has endometriosis.

## 2020-08-15 NOTE — ED Provider Notes (Signed)
[facility] Emergency Department Provider Note  ____________________________________________   Event Date/Time   First MD Initiated Contact with Patient 08/15/20 1024     (approximate)  I have reviewed the triage vital signs and the nursing notes.   HISTORY  Chief Complaint Abdominal Pain and Back Pain    HPI Michele Hansen is a 45 y.o. female presents emergency department with left lower quadrant abdominal pain.  Patient states she has had nausea and vomiting associated with the pain.  Patient has history of endometriosis and recently started her period.  States the pain is worse than normal, does not usually vomit with the pain.  Patient still has her appendix and gallbladder.  States she took multiple over-the-counter medications along with essential oils without any relief.  Past Medical History:  Diagnosis Date   Abnormal uterine bleeding (AUB)    ADD (attention deficit disorder)    Allergies    AR (allergic rhinitis)    Asthma    Constipation    Depression    Endometriosis 2014   Family history of breast cancer    Fatigue    Heavy periods    Infertility, female    Irregular periods    Joint pain    Knee MCL sprain    Lactose intolerance    Multiple food allergies    Bananas, Dates, Avocado, Citrus   Painful menstrual periods    Polycystic ovarian disease 2010   Stress    Vitamin D deficiency    Yeast infection     Patient Active Problem List   Diagnosis Date Noted   Adenomyosis 08/20/2014   Allergic state 08/20/2014   Acute bronchospasm 08/20/2014   Excessive and frequent menstruation with irregular cycle 08/20/2014   Adiposity 06/04/2012   Bilateral polycystic ovarian syndrome 06/04/2012   Dysmenorrhea 01/20/2011    Past Surgical History:  Procedure Laterality Date   BREAST CYST EXCISION Right 2015   [facility] Removed cyst.    BREAST SURGERY Bilateral 2004   breast reduction   cryotherapy     CYSTECTOMY Right 2015   breast    DILATION AND CURETTAGE OF UTERUS     HYSTEROSCOPY WITH D & C N/A 05/13/2014   Procedure: DILATATION AND CURETTAGE /HYSTEROSCOPY;  Surgeon: Herold Harms, MD;  Location: [facility] ORS;  Service: Gynecology;  Laterality: N/A;   MASS EXCISION N/A 2010   from uterus   REDUCTION MAMMAPLASTY Bilateral 2004   Reduction     Prior to Admission medications   Medication Sig Start Date End Date Taking? Authorizing Provider  HYDROcodone-acetaminophen (NORCO/VICODIN) 5-325 MG tablet Take 1 tablet by mouth every 6 (six) hours as needed for moderate pain. 08/15/20  Yes Mattisyn Cardona, Roselyn Bering, PA-C  ondansetron (ZOFRAN-ODT) 4 MG disintegrating tablet Take 1 tablet (4 mg total) by mouth every 8 (eight) hours as needed. 08/15/20  Yes Martha Ellerby, Roselyn Bering, PA-C  acetaminophen (TYLENOL) 500 MG tablet Take 500 mg by mouth every 6 (six) hours as needed.    [provider]  calcium carbonate (TUMS - DOSED IN MG ELEMENTAL CALCIUM) 500 MG chewable tablet Chew 1 tablet by mouth daily.    [provider]  cholecalciferol (VITAMIN D3) 25 MCG (1000 UT) tablet Take 1,000 Units by mouth daily.    [provider]  ibuprofen (ADVIL) 600 MG tablet Take 1 tablet (600 mg total) by mouth every 8 (eight) hours as needed for moderate pain or cramping. 05/28/20   Shaune Pollack, MD  metFORMIN [facility])  500 MG 24 hr tablet Take 1 tablet (500 mg total) by mouth daily with breakfast. 02/20/18   Quillian Quince D, MD  methocarbamol (ROBAXIN) 500 MG tablet Take 1 tablet (500 mg total) by mouth 2 (two) times daily. 08/13/20   Boddu, Belenda Cruise, FNP  promethazine (PHENERGAN) 25 MG suppository Place 1 suppository (25 mg total) rectally every 6 (six) hours as needed for nausea. 03/01/20 03/01/21  Loleta Rose, MD  vitamin E 400 UNIT capsule Take 400 Units by mouth daily.    [provider]    Allergies Banana, Latex, Avocado, Citrus, Date seed extract [zizyphus jujuba], and Tape  Family History  Problem Relation  Age of Onset   Diabetes Father    Colon cancer Father    Obesity Father    Breast cancer Mother    Hyperlipidemia Mother    Depression Maternal Grandfather    Breast cancer Paternal Grandmother    Heart disease Neg Hx    Ovarian cancer Neg Hx     Social History Social History   Tobacco Use   Smoking status: Never   Smokeless tobacco: Never  Vaping Use   Vaping Use: Never used  Substance Use Topics   Alcohol use: Yes    Comment: occas   Drug use: No    Review of Systems  Constitutional: No fever/chills Eyes: No visual changes. ENT: No sore throat. Respiratory: Denies cough Cardiovascular: Denies chest pain Gastrointestinal: Positive abdominal pain Genitourinary: Negative for dysuria. Musculoskeletal: Negative for back pain. Skin: Negative for rash. Psychiatric: no mood changes,     ____________________________________________   PHYSICAL EXAM:  VITAL SIGNS: ED Triage Vitals  Enc Vitals Group     BP 08/15/20 1003 (!) 144/89     Pulse Rate 08/15/20 1003 66     Resp 08/15/20 1003 18     Temp 08/15/20 1003 98.4 F (36.9 C)     Temp Source 08/15/20 1003 Oral     SpO2 08/15/20 1003 100 %     Weight 08/15/20 1004 283 lb 1.1 oz (128.4 kg)     Height 08/15/20 1004 5\' 8"  (1.727 m)     Head Circumference --      Peak Flow --      Pain Score 08/15/20 1004 10     Pain Loc --      Pain Edu? --      Excl. in GC? --     Constitutional: Alert and oriented. Well appearing and in no acute distress. Eyes: Conjunctivae are normal.  Head: Atraumatic. Nose: No congestion/rhinnorhea. Mouth/Throat: Mucous membranes are moist.   Neck:  supple no lymphadenopathy noted Cardiovascular: Normal rate, regular rhythm. Heart sounds are normal Respiratory: Normal respiratory effort.  No retractions, lungs c t a  Abd: soft tender in the left lower quadrant, bs normal all 4 quad GU: deferred Musculoskeletal: FROM all extremities, warm and well perfused Neurologic:  Normal speech  and language.  Skin:  Skin is warm, dry and intact. No rash noted. Psychiatric: Mood and affect are normal. Speech and behavior are normal.  ____________________________________________   LABS (all labs ordered are listed, but only abnormal results are displayed)  Labs Reviewed  COMPREHENSIVE METABOLIC PANEL - Abnormal; Notable for the following components:      Result Value   CO2 21 (*)    All other components within normal limits  LIPASE, BLOOD  CBC  HCG, QUANTITATIVE, PREGNANCY  URINALYSIS, COMPLETE (UACMP) WITH MICROSCOPIC  POC URINE PREG, ED   ____________________________________________  ____________________________________________  RADIOLOGY  CT abdomen/pelvis  ____________________________________________   PROCEDURES  Procedure(s) performed: No  Procedures    ____________________________________________   INITIAL IMPRESSION / ASSESSMENT AND PLAN / ED COURSE  Pertinent labs & imaging results that were available during my care of the patient were reviewed by me and considered in my medical decision making (see chart for details).   The patient is a 45 year old female with history of endometriosis presents with left lower quadrant pain.  See HPI.  Physical exam shows patient appears stable although she is in pain.  DDx: Menstrual cramps, ovarian cyst, diverticulitis, acute appendicitis, acute cholecystitis, ectopic pregnancy  Labs ordered, patient will get fluids and pain medication  Labs are reassuring, CBC, metabolic panel, lipase and beta-hCG are all negative for any abnormalities  CT abdomen/pelvis IV contrast shows a uterine fibroid.  No other abnormalities noted, reviewed by me and confirmed by radiology  I did discuss this with patient.  She will follow-up with her OB/GYN for evaluation of uterine fibroid versus endometriosis.  She was given pain medication and nausea medication.  A work note.  She is to return if worsening.  She is discharged  stable condition.  Michele Hansen was evaluated in Emergency Department on 08/15/2020 for the symptoms described in the history of present illness. She was evaluated in the context of the global COVID-19 pandemic, which necessitated consideration that the patient might be at risk for infection with the SARS-CoV-2 virus that causes COVID-19. Institutional protocols and algorithms that pertain to the evaluation of patients at risk for COVID-19 are in a state of rapid change based on information released by regulatory bodies including the CDC and federal and state organizations. These policies and algorithms were followed during the patient's care in the ED.    As part of my medical decision making, I reviewed the following data within the electronic MEDICAL RECORD NUMBER Nursing notes reviewed and incorporated, Labs reviewed , Old chart reviewed, Radiograph reviewed , Notes from prior ED visits, and [place] Controlled Substance Database  ____________________________________________   FINAL CLINICAL IMPRESSION(S) / ED DIAGNOSES  Final diagnoses:  Acute abdominal pain      NEW MEDICATIONS STARTED DURING THIS VISIT:  Discharge Medication List as of 08/15/2020  1:59 PM     START taking these medications   Details  HYDROcodone-acetaminophen (NORCO/VICODIN) 5-325 MG tablet Take 1 tablet by mouth every 6 (six) hours as needed for moderate pain., Starting Fri 08/15/2020, Normal         Note:  This document was prepared using Dragon voice recognition software and may include unintentional dictation errors.    Faythe Ghee, PA-C 08/15/20 1459    Georga Hacking, MD 08/15/20 [phone]

## 2021-03-18 ENCOUNTER — Other Ambulatory Visit: Payer: Self-pay | Admitting: Obstetrics and Gynecology

## 2021-03-18 DIAGNOSIS — Z9189 Other specified personal risk factors, not elsewhere classified: Secondary | ICD-10-CM

## 2021-03-30 ENCOUNTER — Ambulatory Visit
Admission: RE | Admit: 2021-03-30 | Discharge: 2021-03-30 | Disposition: A | Discharge status: Home / Self Care | Payer: Managed Care, Other (non HMO) | Source: Ambulatory Visit | Attending: Obstetrics and Gynecology | Admitting: Obstetrics and Gynecology

## 2021-03-30 ENCOUNTER — Other Ambulatory Visit: Payer: Self-pay

## 2021-03-30 DIAGNOSIS — Z9189 Other specified personal risk factors, not elsewhere classified: Secondary | ICD-10-CM

## 2021-03-30 MED ORDER — GADOBUTROL 1 MMOL/ML IV SOLN
10.0000 mL | Freq: Once | INTRAVENOUS | Status: AC | PRN
  Administered 2021-03-30: 10 mL via INTRAVENOUS

## 2021-05-12 ENCOUNTER — Emergency Department: Payer: Managed Care, Other (non HMO)

## 2021-05-12 ENCOUNTER — Emergency Department
Admission: EM | Admit: 2021-05-12 | Discharge: 2021-05-13 | Disposition: A | Discharge status: Home / Self Care | Payer: Managed Care, Other (non HMO) | Attending: Emergency Medicine | Admitting: Emergency Medicine

## 2021-05-12 ENCOUNTER — Other Ambulatory Visit: Payer: Self-pay

## 2021-05-12 DIAGNOSIS — R42 Dizziness and giddiness: Secondary | ICD-10-CM | POA: Insufficient documentation

## 2021-05-12 DIAGNOSIS — R55 Syncope and collapse: Secondary | ICD-10-CM | POA: Diagnosis not present

## 2021-05-12 DIAGNOSIS — R519 Headache, unspecified: Secondary | ICD-10-CM | POA: Insufficient documentation

## 2021-05-12 DIAGNOSIS — J45909 Unspecified asthma, uncomplicated: Secondary | ICD-10-CM | POA: Diagnosis not present

## 2021-05-12 LAB — URINALYSIS, ROUTINE W REFLEX MICROSCOPIC
Bilirubin Urine: NEGATIVE
Glucose, UA: NEGATIVE mg/dL
Ketones, ur: NEGATIVE mg/dL
Leukocytes,Ua: NEGATIVE
Nitrite: NEGATIVE
Protein, ur: NEGATIVE mg/dL
Specific Gravity, Urine: 1.006 (ref 1.005–1.030)
pH: 6 (ref 5.0–8.0)

## 2021-05-12 LAB — BASIC METABOLIC PANEL
Anion gap: 6 (ref 5–15)
BUN: 10 mg/dL (ref 6–20)
CO2: 21 mmol/L — ABNORMAL LOW (ref 22–32)
Calcium: 9 mg/dL (ref 8.9–10.3)
Chloride: 111 mmol/L (ref 98–111)
Creatinine, Ser: 0.98 mg/dL (ref 0.44–1.00)
GFR, Estimated: >60 mL/min (ref >60)
Glucose, Bld: 93 mg/dL (ref 70–99)
Potassium: 4.3 mmol/L (ref 3.5–5.1)
Sodium: 138 mmol/L (ref 135–145)

## 2021-05-12 LAB — CBC
HCT: 36.7 % (ref 36.0–46.0)
Hemoglobin: 12 g/dL (ref 12.0–15.0)
MCH: 29.7 pg (ref 26.0–34.0)
MCHC: 32.7 g/dL (ref 30.0–36.0)
MCV: 90.8 fL (ref 80.0–100.0)
Platelets: 249 10*3/uL (ref 150–400)
RBC: 4.04 MIL/uL (ref 3.87–5.11)
RDW: 13.8 % (ref 11.5–15.5)
WBC: 10.2 10*3/uL (ref 4.0–10.5)
nRBC: 0 % (ref 0.0–0.2)

## 2021-05-12 MED ORDER — METOCLOPRAMIDE HCL 10 MG PO TABS
10.0000 mg | ORAL_TABLET | Freq: Once | ORAL | Status: AC
  Administered 2021-05-12: 10 mg via ORAL
  Filled 2021-05-12: qty 1

## 2021-05-12 MED ORDER — MECLIZINE HCL 25 MG PO TABS
25.0000 mg | ORAL_TABLET | Freq: Once | ORAL | Status: AC
  Administered 2021-05-12: 25 mg via ORAL
  Filled 2021-05-12: qty 1

## 2021-05-12 MED ORDER — KETOROLAC TROMETHAMINE 30 MG/ML IJ SOLN
30.0000 mg | Freq: Once | INTRAMUSCULAR | Status: AC
Start: 2021-05-12 — End: 2021-05-12
  Administered 2021-05-12: 30 mg via INTRAVENOUS
  Filled 2021-05-12: qty 1

## 2021-05-12 MED ORDER — ACETAMINOPHEN 325 MG PO TABS
650.0000 mg | ORAL_TABLET | Freq: Once | ORAL | Status: AC
  Administered 2021-05-12: 650 mg via ORAL
  Filled 2021-05-12: qty 2

## 2021-05-12 MED ORDER — MECLIZINE HCL 12.5 MG PO TABS: 12.5000 mg | ORAL_TABLET | Freq: Three times a day (TID) | ORAL | 0 refills | Status: AC | PRN

## 2021-05-12 MED ORDER — METOCLOPRAMIDE HCL 10 MG PO TABS: 10.0000 mg | ORAL_TABLET | Freq: Three times a day (TID) | ORAL | 0 refills | Status: AC

## 2021-05-12 NOTE — ED Triage Notes (Signed)
Pt was at work standing and felt very dizzy and like she was going to pass out. Pt blood pressure was elevated per pt at 132 systolic. Pt still feels very dizzy even when she closes her eyes.  ?

## 2021-05-12 NOTE — ED Provider Triage Note (Signed)
Emergency Medicine Provider Triage Evaluation Note ? ?Michele Hansen , a 46 y.o. female  was evaluated in triage.  Pt complains of dizziness.  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  She reports that the dizziness is severe with all position changes.  However, she denies any prior diagnosis of vertigo.  She reports that her blood pressure was checked and it was in the 130s over 90s which she reports is elevated for her.  She became concerned and came to the emergency room.  She denies chest pain, shortness of breath, any recent symptoms of URI, abdominal pain.  She does endorse that since being in the emergency room she has a slight headache.  She denies any vision changes. ? ?Review of Systems  ?Positive: Dizziness/lightheadedness ?Negative: /Vomiting/chest pain/shortness of breath/vision changes ? ?Physical Exam  ?Ht 5\' 8"  (1.727 m)   Wt 133.8 kg   LMP 04/08/2021 (Approximate)   BMI 44.85 kg/m?  ?Gen:   Awake, no distress   ?Resp:  Normal effort  ?MSK:   Moves extremities without difficulty  ?Other:   ? ?Medical Decision Making  ?Medically screening exam initiated at 7:29 PM.  Appropriate orders placed.  Michele Hansen was informed that the remainder of the evaluation will be completed by another provider, this initial triage assessment does not replace that evaluation, and the importance of remaining in the ED until their evaluation is complete. ? ?Routine triage protocols will be initiated. ?  ?Michele Rayas, NP ?05/12/21 1931 ? ?

## 2021-05-12 NOTE — Discharge Instructions (Addendum)
Your exam, labs, and CT are all normal and reassuring at this time.  No signs of a serious injury to the brain or serious brain bleed.  No signs of any electrolyte abnormalities.  You should follow with your primary provider for ongoing symptoms.  Return to the ED if needed. ?

## 2021-05-12 NOTE — Progress Notes (Signed)
Pt. Still at CT scan. ?

## 2021-05-12 NOTE — ED Notes (Signed)
Pt returned from CT. IV team at bedside.  ?

## 2021-05-12 NOTE — ED Provider Notes (Signed)
? ? ?[facility] ?Emergency Department Provider Note ? ? ? ? Event Date/Time  ? First MD Initiated Contact with Patient 05/12/21 1943   ?  (approximate) ? ? ?History  ? ?Dizziness and Near Syncope ? ? ?HPI ? ?Michele Hansen is a 46 y.o. female with a history of PCOS, asthma, and, depression, presents to the ED with complaints of near syncope.  Patient reports onset of dizziness while was at work.  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  Denies any frank syncope but does report some light sensitivity and headache at this time.  He also reports an elevated blood pressure above her baseline of 120 systolic.  Patient denies any nausea, vomiting, paralysis, tingling, or slurred speech.  She does report a sensation of vertigo when she closes her eyes.  No recent injury, trauma, or illness are reported. ? ? ?Physical Exam  ? ?Triage Vital Signs: ?ED Triage Vitals  ?Enc Vitals Group  ?   BP 05/12/21 1930 (!) 155/93  ?   Pulse Rate 05/12/21 1930 84  ?   Resp 05/12/21 1930 19  ?   Temp 05/12/21 1930 98.5 ?F (36.9 ?C)  ?   Temp Source 05/12/21 1930 Oral  ?   SpO2 05/12/21 1930 100 %  ?   Weight 05/12/21 1926 295 lb (133.8 kg)  ?   Height 05/12/21 1926 5\' 8"  (1.727 m)  ?   Head Circumference --   ?   Peak Flow --   ?   Pain Score 05/12/21 1926 0  ?   Pain Loc --   ?   Pain Edu? --   ?   Excl. in GC? --   ? ? ?Most recent vital signs: ?Vitals:  ? 05/12/21 1930 05/13/21 0019  ?BP: (!) 155/93 131/65  ?Pulse: 84 74  ?Resp: 19 18  ?Temp: 98.5 ?F (36.9 ?C) 98 ?F (36.7 ?C)  ?SpO2: 100% 99%  ? ? ?General Awake, no distress.  ANO x4 ?HEENT NCAT. PERRL. EOMI. No rhinorrhea. Mucous membranes are moist.  ?CV:  Good peripheral perfusion.  ?RESP:  Normal effort.  ?ABD:  No distention.  ?NEURO: Cranial nerves II to XII grossly intact. ? ? ?ED Results / Procedures / Treatments  ? ?Labs ?(all labs ordered are listed, but only abnormal results are displayed) ?Labs  Reviewed  ?BASIC METABOLIC PANEL - Abnormal; Notable for the following components:  ?    Result Value  ? CO2 21 (*)   ? All other components within normal limits  ?URINALYSIS, ROUTINE W REFLEX MICROSCOPIC - Abnormal; Notable for the following components:  ? Color, Urine STRAW (*)   ? APPearance CLEAR (*)   ? Hgb urine dipstick MODERATE (*)   ? Bacteria, UA RARE (*)   ? All other components within normal limits  ?CBC  ?POC URINE PREG, ED  ? ? ? ?EKG ? ?Vent. rate 77 BPM ?PR interval 144 ms ?QRS duration 80 ms ?QT/QTcB 384/434 ms ?P-R-T axes 39 -42 17 ?NSR ? ? ?RADIOLOGY ? ?I personally viewed and evaluated these images as part of my medical decision making, as well as reviewing the written report by the radiologist. ? ?ED Provider Interpretation: no acute findings} ? ?No results found. ? ? ?PROCEDURES: ? ?Critical Care performed: No ? ?Procedures ? ? ?MEDICATIONS ORDERED IN ED: ?Medications  ?metoCLOPramide (REGLAN) tablet 10 mg (10 mg Oral Given 05/12/21 2148)  ?acetaminophen (TYLENOL) tablet 650  mg (650 mg Oral Given 05/12/21 2148)  ?meclizine (ANTIVERT) tablet 25 mg (25 mg Oral Given 05/12/21 2148)  ?ketorolac (TORADOL) 30 MG/ML injection 30 mg (30 mg Intravenous Given 05/12/21 2359)  ? ? ? ?IMPRESSION / MDM / ASSESSMENT AND PLAN / ED COURSE  ?I reviewed the triage vital signs and the nursing notes. ?             ?               ? ?Differential diagnosis includes, but is not limited to, intracranial hemorrhage, meningitis/encephalitis, previous head trauma, cavernous venous thrombosis, tension headache, temporal arteritis, migraine or migraine equivalent, idiopathic intracranial hypertension, and non-specific headache. ? ?Patient to the ED for evaluation of near syncope, dizziness, and vertigo from work.  She denies any head injury or LOC.  She presents with persistent headache pain and elevated blood pressure but denies any weakness or paralysis.  Patient is evaluated for complaints in the ED, and found to have overall  reassuring work-up.  No lab abnormalities are appreciated.  No signs of acute UTI or electrolyte abnormality.  Patient is further evaluated with a CT scan which is negative for any acute intra cranial process.  Patient reports improvement of her symptoms after IV medication administration.  Patient's diagnosis is consistent with dizziness and vertigo. Patient will be discharged home with prescriptions for Reglan and meclizine. Patient is to follow up with her primary provider as needed or otherwise directed. Patient is given ED precautions to return to the ED for any worsening or new symptoms. ? ? ?FINAL CLINICAL IMPRESSION(S) / ED DIAGNOSES  ? ?Final diagnoses:  ?Near syncope  ?Dizziness  ? ? ? ?Rx / DC Orders  ? ?ED Discharge Orders   ? ?      Ordered  ?  metoCLOPramide (REGLAN) 10 MG tablet  3 times daily with meals       ? 05/12/21 2335  ?  meclizine (ANTIVERT) 12.5 MG tablet  3 times daily PRN       ? 05/12/21 2335  ? ?  ?  ? ?  ? ? ? ?Note:  This document was prepared using Dragon voice recognition software and may include unintentional dictation errors. ? ?  ?Melvenia Needles, PA-C ?05/14/21 1820 ? ?  ?Rada Hay, MD ?05/15/21 1609 ? ?

## 2021-05-12 NOTE — ED Notes (Signed)
Poct pregnancy Negative 

## 2021-05-25 ENCOUNTER — Ambulatory Visit
Admission: RE | Admit: 2021-05-25 | Discharge: 2021-05-25 | Disposition: A | Discharge status: Home / Self Care | Payer: Managed Care, Other (non HMO) | Source: Ambulatory Visit | Attending: Family Medicine | Admitting: Family Medicine

## 2021-05-25 ENCOUNTER — Other Ambulatory Visit: Payer: Self-pay | Admitting: Family Medicine

## 2021-05-25 ENCOUNTER — Ambulatory Visit
Admission: RE | Admit: 2021-05-25 | Discharge: 2021-05-25 | Disposition: A | Discharge status: Home / Self Care | Payer: Managed Care, Other (non HMO) | Attending: Family Medicine | Admitting: Family Medicine

## 2021-05-25 DIAGNOSIS — Z111 Encounter for screening for respiratory tuberculosis: Secondary | ICD-10-CM

## 2021-10-29 ENCOUNTER — Ambulatory Visit: Payer: 59

## 2021-10-30 ENCOUNTER — Ambulatory Visit (LOCAL_COMMUNITY_HEALTH_CENTER): Payer: 59

## 2021-10-30 DIAGNOSIS — Z23 Encounter for immunization: Secondary | ICD-10-CM

## 2021-10-30 NOTE — Progress Notes (Signed)
In nurse clinic for flu vaccine. Tolerated vaccine well today. Updated [facility] copy given. Deardra Hinkley, RN  

## 2023-03-28 ENCOUNTER — Ambulatory Visit (LOCAL_COMMUNITY_HEALTH_CENTER): Payer: Self-pay

## 2023-03-28 DIAGNOSIS — Z23 Encounter for immunization: Secondary | ICD-10-CM

## 2023-03-28 DIAGNOSIS — Z0184 Encounter for antibody response examination: Secondary | ICD-10-CM

## 2023-03-28 NOTE — Progress Notes (Signed)
 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 Per Epic, has completed  Hep B vaccine series and has hx chicken pox.   ROI signed. Plans to view results on MyChart. RN explained she will be contacted by [facility] with results and paper result copy can be provided if needed. Questions answered and reports understanding. RN walked patient to lab. Jerel Shepherd, RN

## 2023-03-29 LAB — VARICELLA ZOSTER ANTIBODY, IGG: Varicella zoster IgG: REACTIVE

## 2023-03-29 LAB — HEPATITIS B SURFACE ANTIBODY, QUANTITATIVE: Hepatitis B Surf Ab Quant: 58.6 m[IU]/mL

## 2023-04-01 NOTE — Progress Notes (Signed)
 Varicella titer Reactive, showing immunity and per SO Dr Lorrin Mais, no varicella vaccine indicated.  Hep B titer consistent with immunity and per SO Dr Lorrin Mais, no Hep B vaccine indicated. Patient contacted on 03/30/2023  and results reviewed. Patient states she does not need paper result copy from [facility]. Questions answered and reports understanding. Jerel Shepherd, RN
